# Patient Record
Sex: Female | Born: 1950 | Race: White | Hispanic: No | Marital: Married | Smoking: Former smoker
Health system: Southern US, Community
[De-identification: ages and names within clinical notes are randomized; demographics above are authoritative.]

## PROBLEM LIST (undated history)

## (undated) DIAGNOSIS — R002 Palpitations: Secondary | ICD-10-CM

## (undated) DIAGNOSIS — I10 Essential (primary) hypertension: Secondary | ICD-10-CM

## (undated) DIAGNOSIS — R87619 Unspecified abnormal cytological findings in specimens from cervix uteri: Secondary | ICD-10-CM

## (undated) DIAGNOSIS — B009 Herpesviral infection, unspecified: Secondary | ICD-10-CM

## (undated) DIAGNOSIS — H269 Unspecified cataract: Secondary | ICD-10-CM

## (undated) DIAGNOSIS — C4491 Basal cell carcinoma of skin, unspecified: Secondary | ICD-10-CM

## (undated) DIAGNOSIS — M6530 Trigger finger, unspecified finger: Secondary | ICD-10-CM

## (undated) DIAGNOSIS — D219 Benign neoplasm of connective and other soft tissue, unspecified: Secondary | ICD-10-CM

## (undated) DIAGNOSIS — I471 Supraventricular tachycardia: Secondary | ICD-10-CM

## (undated) DIAGNOSIS — G5600 Carpal tunnel syndrome, unspecified upper limb: Secondary | ICD-10-CM

## (undated) DIAGNOSIS — N3941 Urge incontinence: Secondary | ICD-10-CM

## (undated) DIAGNOSIS — M1990 Unspecified osteoarthritis, unspecified site: Secondary | ICD-10-CM

## (undated) HISTORY — DX: Palpitations: R00.2

## (undated) HISTORY — DX: Urge incontinence: N39.41

## (undated) HISTORY — DX: Unspecified abnormal cytological findings in specimens from cervix uteri: R87.619

## (undated) HISTORY — DX: Supraventricular tachycardia: I47.1

## (undated) HISTORY — DX: Trigger finger, unspecified finger: M65.30

## (undated) HISTORY — DX: Benign neoplasm of connective and other soft tissue, unspecified: D21.9

## (undated) HISTORY — DX: Essential (primary) hypertension: I10

## (undated) HISTORY — DX: Unspecified cataract: H26.9

## (undated) HISTORY — DX: Herpesviral infection, unspecified: B00.9

## (undated) HISTORY — DX: Basal cell carcinoma of skin, unspecified: C44.91

## (undated) HISTORY — PX: COLONOSCOPY: SHX174

---

## 1971-11-25 HISTORY — PX: BREAST ENHANCEMENT SURGERY: SHX7

## 1998-10-03 ENCOUNTER — Other Ambulatory Visit: Admission: RE | Admit: 1998-10-03 | Discharge: 1998-10-03 | Payer: Self-pay | Admitting: *Deleted

## 1999-12-30 ENCOUNTER — Other Ambulatory Visit: Admission: RE | Admit: 1999-12-30 | Discharge: 1999-12-30 | Payer: Self-pay | Admitting: *Deleted

## 2000-12-30 ENCOUNTER — Other Ambulatory Visit: Admission: RE | Admit: 2000-12-30 | Discharge: 2000-12-30 | Payer: Self-pay | Admitting: *Deleted

## 2002-02-24 ENCOUNTER — Other Ambulatory Visit: Admission: RE | Admit: 2002-02-24 | Discharge: 2002-02-24 | Payer: Self-pay | Admitting: *Deleted

## 2003-04-17 ENCOUNTER — Other Ambulatory Visit: Admission: RE | Admit: 2003-04-17 | Discharge: 2003-04-17 | Payer: Self-pay | Admitting: *Deleted

## 2003-11-25 HISTORY — PX: FACIAL COSMETIC SURGERY: SHX629

## 2004-07-24 ENCOUNTER — Other Ambulatory Visit: Admission: RE | Admit: 2004-07-24 | Discharge: 2004-07-24 | Payer: Self-pay | Admitting: *Deleted

## 2005-11-24 HISTORY — PX: GREAT TOE ARTHRODESIS, INTERPHALANGEAL JOINT: SUR55

## 2006-01-19 ENCOUNTER — Other Ambulatory Visit: Admission: RE | Admit: 2006-01-19 | Discharge: 2006-01-19 | Payer: Self-pay | Admitting: Obstetrics and Gynecology

## 2006-10-06 ENCOUNTER — Ambulatory Visit (HOSPITAL_BASED_OUTPATIENT_CLINIC_OR_DEPARTMENT_OTHER): Admission: RE | Admit: 2006-10-06 | Discharge: 2006-10-06 | Payer: Self-pay | Admitting: Orthopedic Surgery

## 2006-10-24 HISTORY — PX: TONSILLECTOMY: SUR1361

## 2007-01-20 ENCOUNTER — Other Ambulatory Visit: Admission: RE | Admit: 2007-01-20 | Discharge: 2007-01-20 | Payer: Self-pay | Admitting: Obstetrics & Gynecology

## 2008-02-07 ENCOUNTER — Other Ambulatory Visit: Admission: RE | Admit: 2008-02-07 | Discharge: 2008-02-07 | Payer: Self-pay | Admitting: Obstetrics & Gynecology

## 2008-04-24 HISTORY — PX: HYSTEROSCOPY WITH RESECTOSCOPE: SHX5395

## 2008-04-25 ENCOUNTER — Ambulatory Visit (HOSPITAL_COMMUNITY): Admission: RE | Admit: 2008-04-25 | Discharge: 2008-04-25 | Payer: Self-pay | Admitting: Obstetrics & Gynecology

## 2008-04-25 ENCOUNTER — Encounter: Payer: Self-pay | Admitting: Obstetrics & Gynecology

## 2009-03-08 ENCOUNTER — Other Ambulatory Visit: Admission: RE | Admit: 2009-03-08 | Discharge: 2009-03-08 | Payer: Self-pay | Admitting: Obstetrics & Gynecology

## 2011-04-08 NOTE — Op Note (Signed)
NAME:  Lindsey Robinson, Lindsey Robinson             ACCOUNT NO.:  [id number]   MEDICAL RECORD NO.:  [id number]          PATIENT TYPE:  AMB   LOCATION:  SDC                           FACILITY:  [facility]   PHYSICIAN:  M. Leda Quail, MD  DATE OF BIRTH:  1951-07-26   DATE OF PROCEDURE:  04/25/2008  DATE OF DISCHARGE:                               OPERATIVE REPORT   PREOPERATIVE DIAGNOSES:  81. A 60 year old G2, P2 married white female with postmenopausal      bleeding.  2. Endometrial polyp.  3. History of uterine fibroids.   POSTOPERATIVE DIAGNOSES:  3. A 60 year old G2, P2 married white female with postmenopausal      bleeding.  2. Endometrial polyp.  3. History of uterine fibroids.   PROCEDURE:  Hysteroscopy with polyp resection using a resectoscope  followed by dilation and curettage.   SURGEON:  M. Leda Quail, MD   ASSISTANT:  OR staff.   ANESTHESIA:  General using LMA.  Dr. Jean Rosenthal over saw the case.   FINDINGS:  Endometrial polyp.   SPECIMENS:  Polyp and endometrial curettings sent together to pathology.   ESTIMATED BLOOD LOSS:  Minimal.   FLUIDS:  400 mL of LR.   URINE OUTPUT:  150 mL of clear urine drained in and out Foley catheter.   FLUID DEFICIT:  Sorbitol 2% was used for the hysteroscopic media and the  deficit was 55 mL.   COMPLICATIONS:  None.   INDICATIONS:  Lindsey Robinson is a very nice 60 year old G2, P2 married white  female with postmenopausal bleeding.  She presented with bright red  bleeding in the office.  This was evaluated by performing an endometrial  biopsy.  Pieces of polyp were noted on path report.  Because of this, I  discussed with the patient proceeding with the ultrasound to take a look  at the endometrial thickness or just going to the operating room and  doing a hysteroscopy polyp resection if one indeed was seen.  The  patient opted for the second option because it required fewer steps and  also because I did feel like there was going to be a polyp  present.  She  was counseled about risks and benefits.  These are documented in the  office chart.  The patient presents for this today.  Informed consent  was present on her chart.   PROCEDURE:  The patient was taken to the operating room and she was  placed in a supine position.  LMA, using general anesthesia, was  administered by the anesthesia staff without difficulty.  Legs were  positioned in the low lithotomy position in the [place] stirrups.  Perineum, thighs, and vagina were prepped and draped in normal sterile  fashion.  A red rubber Foley catheter was inserted to the bladder and  the bladder was drained of all urine.   A speculum was placed into the vagina.  The anterior lip of the cervix  was grasped with a single-tooth tenaculum.  Paracervical block with 1%  lidocaine was instilled in the cervix.  A 10 mL total was instilled and  2-1/2 mL were placed  at the 3, 6, 9, and 12 o'clock positions  respectively.  The uterus was then sounded to 8 cm.  Using [facility]  dilators, the cervix was dilated up to 19.  A 3.9-diagnostic  hysteroscope was obtained and was passed through the endocervical canal.  There was a sizeable polyp noted at the fundus off to the patient's  right.  Further documentation of this tubal ostia and a thin endometrial  cavity otherwise was obtained.  The cervix was then dilated up to #23  and a polyp forceps was obtained.  The polyp was grasped and with 4 or 5  passes of the polyp forceps, most of the polyp was removed.  The cervix  was then dilated up to #29 and the resectoscope was passed through the  cervical canal.  There was a small amount of polyp left.  A single loop  was already placed on the resectoscope.  By drawing the loop back to the  scope, base of the polyp was cauterized and any extra filthy tissue was  removed.  The resectoscope was removed at this point.  Then, using a #1  tooth curette, the endometrial cavity was curetted, and a rough gritty   texture was noted.  Very minimal tissue was obtained.  At this point,  all instruments were removed from the vagina.  The tenaculum was removed  from the cervix.  There was no bleeding from the tenaculum site.  The  speculum was removed from the vagina.  Betadine prep solution has  cleansed off the skin and the patient was placed back in the supine  position.  Sponge, lap, and instrument counts were correct x2.  There  was no needle used on the field.  The patient tolerated the procedure  well.  She was awakened from anesthesia and taken to the recovery room  in stable addition.      Lum Keas, MD  Electronically Signed     MSM/MEDQ  D:  04/25/2008  T:  04/26/2008  Job:  [id number]

## 2011-04-11 NOTE — Op Note (Signed)
Lindsey Robinson, Lindsey Robinson             ACCOUNT NO.:  [id number]   MEDICAL RECORD NO.:  [id number]          PATIENT TYPE:  AMB   LOCATION:  DSC                          FACILITY:  [facility]   PHYSICIAN:  Leonides Grills, M.D.     DATE OF BIRTH:  Apr 08, 1951   DATE OF PROCEDURE:  10/06/2006  DATE OF DISCHARGE:                                 OPERATIVE REPORT   PREOPERATIVE DIAGNOSIS:  Right hallux rigidus.   POSTOPERATIVE DIAGNOSIS:  Right hallux rigidus.   OPERATION:  Right toe cheilectomy.   ANESTHESIA:  General.   SURGEON:  Dr. Leonides Grills.   ASSISTANT:  Evlyn Kanner, P.A.-C.   ESTIMATED BLOOD LOSS:  Minimal.   TOURNIQUET TIME:  Approximately 20 minutes.   COMPLICATIONS:  None.   DISPOSITION:  Stable to the PR.   INDICATIONS FOR PROCEDURE:  This is a 60 year old female who has had  longstanding dorsal right great toe pain that was interfering with her life  and she could not do what she wanted to do despite conservative management.  She consented to the above procedure with risks which include infection or  vessel injury, persistent pain, worsening pain, stiffness or arthritis, and  the possibility of a future fusion were all explained.  Questions were  answered.   OPERATION:  The patient was brought into the operating room and placed in  the supine position.  After adequate general endotracheal tube anesthesia  was administered as well as Ancef 1 gram IV piggyback, right lower extremity  was then prepped and draped in a sterile fashion and placed thigh  tourniquet.  Limb was gravity exsanguinated and tourniquet was elevated to  290 mmHg.  A longitudinal incision over the dorsomedial aspect of the right  great toe MTP joint was then made.  Dissection was then carried down through  skin.  Hemostasis was obtained.  A dorsal capsulotomy was made just medial  to the extensor hallucis longus.  Once this was done, soft tissue was  elevated, both medially and laterally.  This exposed  the osteophytes and a  loose body dorsally.  The loose body was then removed with a rongeur.  With  a saggital saw, the dorsal spur was then removed as well as with a rongeur.  Both medial and lateral gutters were also debrided with a rongeur as well.  The toe was ranged.  There was no impingement and the remaining part of the  cartilage looked mildly to moderately arthritic.  The dorsal spur at the  base of the proximal phalanx was also debrided with a rongeur as well.  The  area was copiously irrigated with normal saline.  Range of motion of the toe  was improved to about 55 degrees of dorsiflexion.  Once the area was  copiously irrigated with normal saline, bone wax was applied to exposed bone  surfaces.  Capsule was closed with a 3-0 Vicryl stitch.  Tourniquet was  deflated.  Hemostasis was obtained.  SubQ was closed with 3-0 Vicryl.  Skin  was closed with 4-0 nylon.  Sterile dressing was applied.  Hard-soled shoe  was applied.  The patient was taken to the PR.      Leonides Grills, M.D.  Electronically Signed     PB/MEDQ  D:  10/06/2006  T:  10/07/2006  Job:  [id number]

## 2011-08-21 LAB — CBC
HCT: 42
Hemoglobin: 14.6
MCHC: 34.9
MCV: 94.6
Platelets: 264
RBC: 4.44
RDW: 12.6
WBC: 3.8 — ABNORMAL LOW

## 2011-08-21 LAB — URINALYSIS, ROUTINE W REFLEX MICROSCOPIC
Bilirubin Urine: NEGATIVE
Glucose, UA: NEGATIVE
Hgb urine dipstick: NEGATIVE
Ketones, ur: NEGATIVE
Nitrite: NEGATIVE
Protein, ur: NEGATIVE
Specific Gravity, Urine: <1.005 — ABNORMAL LOW
Urobilinogen, UA: 0.2
pH: 6

## 2011-11-25 HISTORY — PX: CATARACT EXTRACTION: SUR2

## 2012-10-14 ENCOUNTER — Other Ambulatory Visit: Payer: Self-pay | Admitting: Orthopedic Surgery

## 2012-10-25 ENCOUNTER — Encounter (HOSPITAL_BASED_OUTPATIENT_CLINIC_OR_DEPARTMENT_OTHER): Payer: Self-pay | Admitting: *Deleted

## 2012-10-25 NOTE — H&P (Signed)
  Lindsey Robinson is an 61 y.o. female.   Chief Complaint: c/o chronic and progressive numbness and tingling of the left hand HPI: Lindsey Robinson is a well known family friend and patient who has had chronic carpal tunnel syndrome for years. She has had multiple injections and has now decided to proceed with release of her transverse carpal ligament. The surgery, after care, risks and benefits were described in detail.    Past Medical History  Diagnosis Date  . Arthritis   . Carpal tunnel syndrome     Past Surgical History  Procedure Date  . Dilation and curettage of uterus 2009  . Great toe arthrodesis, interphalangeal joint 2007    rt  . Tonsillectomy   . Colonoscopy   . Facial cosmetic surgery     No family history on file. Social History:  reports that she has never smoked. She does not have any smokeless tobacco history on file. She reports that she drinks alcohol. She reports that she does not use illicit drugs.  Allergies: No Known Allergies  No prescriptions prior to admission    No results found for this or any previous visit (from the past 48 hour(s)).  No results found.   Pertinent items are noted in HPI.  Height 5\' 2"  (1.575 m), weight 49.896 kg (110 lb).  General appearance: alert Head: Normocephalic, without obvious abnormality Neck: supple, symmetrical, trachea midline Resp: clear to auscultation bilaterally Cardio: regular rate and rhythm GI: normal findings: bowel sounds normal Extremities: Exam of the left hand reveals positive Phalen's and Tinel's. FROM of the digits and wrist. Normal sweat pattern.Review of the NCV reveals moderate left CTS Pulses: 2+ and symmetric Skin: normal Neurologic: Grossly normal    Assessment/Plan Impression: Left CTS  Plan: To the OR for left CTR. The procedure, risks,benefits and post-op course were discussed with the patient at length and they were in agreement with the plan.   DASNOIT,[name] 10/25/2012, 12:07  PM     H&P documentation: 10/26/2012  -History and Physical Reviewed  -Patient has been re-examined  -No change in the plan of care  [name], MD

## 2012-10-26 ENCOUNTER — Encounter (HOSPITAL_BASED_OUTPATIENT_CLINIC_OR_DEPARTMENT_OTHER): Payer: Self-pay | Admitting: Anesthesiology

## 2012-10-26 ENCOUNTER — Ambulatory Visit (HOSPITAL_BASED_OUTPATIENT_CLINIC_OR_DEPARTMENT_OTHER): Payer: BC Managed Care – PPO | Admitting: Anesthesiology

## 2012-10-26 ENCOUNTER — Encounter (HOSPITAL_BASED_OUTPATIENT_CLINIC_OR_DEPARTMENT_OTHER)
Admission: RE | Disposition: A | Discharge status: Home / Self Care | Payer: Self-pay | Source: Ambulatory Visit | Attending: Orthopedic Surgery

## 2012-10-26 ENCOUNTER — Ambulatory Visit (HOSPITAL_BASED_OUTPATIENT_CLINIC_OR_DEPARTMENT_OTHER)
Admission: RE | Admit: 2012-10-26 | Discharge: 2012-10-26 | Disposition: A | Discharge status: Home / Self Care | Payer: BC Managed Care – PPO | Source: Ambulatory Visit | Attending: Orthopedic Surgery | Admitting: Orthopedic Surgery

## 2012-10-26 ENCOUNTER — Encounter (HOSPITAL_BASED_OUTPATIENT_CLINIC_OR_DEPARTMENT_OTHER): Payer: Self-pay | Admitting: *Deleted

## 2012-10-26 DIAGNOSIS — G5600 Carpal tunnel syndrome, unspecified upper limb: Secondary | ICD-10-CM | POA: Insufficient documentation

## 2012-10-26 DIAGNOSIS — M129 Arthropathy, unspecified: Secondary | ICD-10-CM | POA: Insufficient documentation

## 2012-10-26 HISTORY — DX: Carpal tunnel syndrome, unspecified upper limb: G56.00

## 2012-10-26 HISTORY — DX: Unspecified osteoarthritis, unspecified site: M19.90

## 2012-10-26 HISTORY — PX: CARPAL TUNNEL RELEASE: SHX101

## 2012-10-26 LAB — POCT HEMOGLOBIN-HEMACUE: Hemoglobin: 14.6 g/dL (ref 12.0–15.0)

## 2012-10-26 SURGERY — CARPAL TUNNEL RELEASE
Anesthesia: General | Site: Wrist | Laterality: Left | Wound class: Clean

## 2012-10-26 MED ORDER — LACTATED RINGERS IV SOLN
INTRAVENOUS | Status: DC
  Administered 2012-10-26: 20 mL/h via INTRAVENOUS

## 2012-10-26 MED ORDER — OXYCODONE HCL 5 MG/5ML PO SOLN: 5.0000 mg | Freq: Once | ORAL | Status: DC | PRN

## 2012-10-26 MED ORDER — PROPOFOL 10 MG/ML IV BOLUS
INTRAVENOUS | Status: DC | PRN
  Administered 2012-10-26: 150 mg via INTRAVENOUS

## 2012-10-26 MED ORDER — PROMETHAZINE HCL 25 MG/ML IJ SOLN: 6.2500 mg | INTRAMUSCULAR | Status: DC | PRN

## 2012-10-26 MED ORDER — MIDAZOLAM HCL 5 MG/5ML IJ SOLN
INTRAMUSCULAR | Status: DC | PRN
  Administered 2012-10-26: 1 mg via INTRAVENOUS

## 2012-10-26 MED ORDER — DEXAMETHASONE SODIUM PHOSPHATE 4 MG/ML IJ SOLN
INTRAMUSCULAR | Status: DC | PRN
  Administered 2012-10-26: 8 mg via INTRAVENOUS

## 2012-10-26 MED ORDER — ONDANSETRON HCL 4 MG/2ML IJ SOLN
INTRAMUSCULAR | Status: DC | PRN
  Administered 2012-10-26: 4 mg via INTRAVENOUS

## 2012-10-26 MED ORDER — MEPERIDINE HCL 25 MG/ML IJ SOLN: 6.2500 mg | INTRAMUSCULAR | Status: DC | PRN

## 2012-10-26 MED ORDER — OXYCODONE HCL 5 MG PO TABS: 5.0000 mg | ORAL_TABLET | Freq: Once | ORAL | Status: DC | PRN

## 2012-10-26 MED ORDER — LIDOCAINE HCL 2 % IJ SOLN
INTRAMUSCULAR | Status: DC | PRN
  Administered 2012-10-26: 4 mL

## 2012-10-26 MED ORDER — FENTANYL CITRATE 0.05 MG/ML IJ SOLN
INTRAMUSCULAR | Status: DC | PRN
  Administered 2012-10-26 (×2): 50 ug via INTRAVENOUS

## 2012-10-26 MED ORDER — HYDROCODONE-ACETAMINOPHEN 5-325 MG PO TABS: ORAL_TABLET | ORAL | Status: DC

## 2012-10-26 MED ORDER — CHLORHEXIDINE GLUCONATE 4 % EX LIQD: 60.0000 mL | Freq: Once | CUTANEOUS | Status: DC

## 2012-10-26 MED ORDER — FENTANYL CITRATE 0.05 MG/ML IJ SOLN: 25.0000 ug | INTRAMUSCULAR | Status: DC | PRN

## 2012-10-26 MED ORDER — MIDAZOLAM HCL 2 MG/2ML IJ SOLN: 0.5000 mg | Freq: Once | INTRAMUSCULAR | Status: DC | PRN

## 2012-10-26 SURGICAL SUPPLY — 37 items
BANDAGE ADHESIVE 1X3 (GAUZE/BANDAGES/DRESSINGS) IMPLANT
BANDAGE ELASTIC 3 VELCRO ST LF (GAUZE/BANDAGES/DRESSINGS) ×2 IMPLANT
BLADE SURG 15 STRL LF DISP TIS (BLADE) ×1 IMPLANT
BLADE SURG 15 STRL SS (BLADE) ×2
BNDG CMPR 9X4 STRL LF SNTH (GAUZE/BANDAGES/DRESSINGS) ×1
BNDG ESMARK 4X9 LF (GAUZE/BANDAGES/DRESSINGS) ×1 IMPLANT
BRUSH SCRUB EZ PLAIN DRY (MISCELLANEOUS) ×2 IMPLANT
CLOTH BEACON ORANGE TIMEOUT ST (SAFETY) ×2 IMPLANT
CORDS BIPOLAR (ELECTRODE) IMPLANT
COVER MAYO STAND STRL (DRAPES) ×2 IMPLANT
COVER TABLE BACK 60X90 (DRAPES) ×2 IMPLANT
CUFF TOURNIQUET SINGLE 18IN (TOURNIQUET CUFF) ×1 IMPLANT
DECANTER SPIKE VIAL GLASS SM (MISCELLANEOUS) IMPLANT
DRAPE EXTREMITY T 121X128X90 (DRAPE) ×2 IMPLANT
DRAPE SURG 17X23 STRL (DRAPES) ×2 IMPLANT
GLOVE BIO SURGEON STRL SZ 6.5 (GLOVE) ×1 IMPLANT
GLOVE BIOGEL M STRL SZ7.5 (GLOVE) ×2 IMPLANT
GLOVE ORTHO TXT STRL SZ7.5 (GLOVE) ×2 IMPLANT
GOWN PREVENTION PLUS XLARGE (GOWN DISPOSABLE) ×2 IMPLANT
GOWN PREVENTION PLUS XXLARGE (GOWN DISPOSABLE) ×4 IMPLANT
NEEDLE 27GAX1X1/2 (NEEDLE) ×1 IMPLANT
PACK BASIN DAY SURGERY FS (CUSTOM PROCEDURE TRAY) ×2 IMPLANT
PAD CAST 3X4 CTTN HI CHSV (CAST SUPPLIES) ×1 IMPLANT
PADDING CAST ABS 4INX4YD NS (CAST SUPPLIES) ×1
PADDING CAST ABS COTTON 4X4 ST (CAST SUPPLIES) ×1 IMPLANT
PADDING CAST COTTON 3X4 STRL (CAST SUPPLIES) ×2
SPLINT PLASTER CAST XFAST 3X15 (CAST SUPPLIES) ×5 IMPLANT
SPLINT PLASTER XTRA FASTSET 3X (CAST SUPPLIES) ×5
SPONGE GAUZE 4X4 12PLY (GAUZE/BANDAGES/DRESSINGS) ×2 IMPLANT
STOCKINETTE 4X48 STRL (DRAPES) ×2 IMPLANT
STRIP CLOSURE SKIN 1/2X4 (GAUZE/BANDAGES/DRESSINGS) ×2 IMPLANT
SUT PROLENE 3 0 PS 2 (SUTURE) ×2 IMPLANT
SYR 3ML 23GX1 SAFETY (SYRINGE) IMPLANT
SYR CONTROL 10ML LL (SYRINGE) ×1 IMPLANT
TRAY DSU PREP LF (CUSTOM PROCEDURE TRAY) ×2 IMPLANT
UNDERPAD 30X30 INCONTINENT (UNDERPADS AND DIAPERS) ×2 IMPLANT
WATER STERILE IRR 1000ML POUR (IV SOLUTION) ×2 IMPLANT

## 2012-10-26 NOTE — Anesthesia Preprocedure Evaluation (Signed)
Anesthesia Evaluation  Patient identified by MRN, date of birth, ID band Patient awake    Reviewed: Allergy & Precautions, H&P , NPO status , Patient's Chart, lab work & pertinent test results  History of Anesthesia Complications Negative for: history of anesthetic complications  Airway Mallampati: I TM Distance: >3 FB Neck ROM: Full    Dental  (+) Teeth Intact and Dental Advisory Given   Pulmonary neg pulmonary ROS,  breath sounds clear to auscultation  Pulmonary exam normal       Cardiovascular negative cardio ROS  Rhythm:Regular Rate:Normal     Neuro/Psych negative neurological ROS     GI/Hepatic negative GI ROS, Neg liver ROS,   Endo/Other  negative endocrine ROS  Renal/GU negative Renal ROS     Musculoskeletal   Abdominal (+) - obese,   Peds  Hematology   Anesthesia Other Findings   Reproductive/Obstetrics                           Anesthesia Physical Anesthesia Plan  ASA: I  Anesthesia Plan: General   Post-op Pain Management:    Induction: Intravenous  Airway Management Planned: LMA  Additional Equipment:   Intra-op Plan:   Post-operative Plan:   Informed Consent: I have reviewed the patients History and Physical, chart, labs and discussed the procedure including the risks, benefits and alternatives for the proposed anesthesia with the patient or authorized representative who has indicated his/her understanding and acceptance.   Dental advisory given  Plan Discussed with: CRNA and Surgeon  Anesthesia Plan Comments: (Plan routine monitors, GA- LMA OK)        Anesthesia Quick Evaluation

## 2012-10-26 NOTE — Anesthesia Procedure Notes (Signed)
Procedure Name: LMA Insertion Date/Time: 10/26/2012 9:37 AM Performed by: Norva Pavlov Pre-anesthesia Checklist: Patient identified, Emergency Drugs available, Suction available and Patient being monitored Patient Re-evaluated:Patient Re-evaluated prior to inductionOxygen Delivery Method: Circle System Utilized Preoxygenation: Pre-oxygenation with 100% oxygen Intubation Type: IV induction Ventilation: Mask ventilation without difficulty LMA: LMA inserted LMA Size: 3.0 Number of attempts: 1 Airway Equipment and Method: bite block Placement Confirmation: positive ETCO2 Tube secured with: Tape Dental Injury: Teeth and Oropharynx as per pre-operative assessment

## 2012-10-26 NOTE — Transfer of Care (Signed)
Immediate Anesthesia Transfer of Care Note  Patient: Lindsey Robinson  Procedure(s) Performed: Procedure(s) (LRB): CARPAL TUNNEL RELEASE (Left)  Patient Location: PACU  Anesthesia Type: General  Level of Consciousness: awake, alert  and oriented  Airway & Oxygen Therapy: Patient Spontanous Breathing and Patient connected to face mask oxygen  Post-op Assessment: Report given to PACU RN and Post -op Vital signs reviewed and stable  Post vital signs: Reviewed and stable  Complications: No apparent anesthesia complications

## 2012-10-26 NOTE — Anesthesia Postprocedure Evaluation (Signed)
  Anesthesia Post-op Note  Patient: Lindsey Robinson  Procedure(s) Performed: Procedure(s) (LRB) with comments: CARPAL TUNNEL RELEASE (Left)  Patient Location: PACU  Anesthesia Type:General  Level of Consciousness: awake, alert , oriented and patient cooperative  Airway and Oxygen Therapy: Patient Spontanous Breathing  Post-op Pain: none  Post-op Assessment: Post-op Vital signs reviewed, Patient's Cardiovascular Status Stable, Respiratory Function Stable, Patent Airway, No signs of Nausea or vomiting and Pain level controlled  Post-op Vital Signs: Reviewed and stable  Complications: No apparent anesthesia complications

## 2012-10-26 NOTE — Op Note (Signed)
[id number] 

## 2012-10-26 NOTE — Brief Op Note (Signed)
10/26/2012  9:56 AM  PATIENT:  Lindsey Robinson  61 y.o. female  PRE-OPERATIVE DIAGNOSIS:  left carpal tunnel syndrome   POST-OPERATIVE DIAGNOSIS:  left Carpal Tunnel Syndrome  PROCEDURE:  Procedure(s) (LRB) with comments: CARPAL TUNNEL RELEASE (Left)  SURGEON:  Surgeon(s) and Role:    * [name]., MD - Primary  PHYSICIAN ASSISTANT:   ASSISTANTS:Yarimar Lavis Dasnoit,P.A-C   ANESTHESIA:   general  EBL:  Total I/O In: 200 [I.V.:200] Out: -   BLOOD ADMINISTERED:none  DRAINS: none   LOCAL MEDICATIONS USED:  XYLOCAINE   SPECIMEN:  No Specimen  DISPOSITION OF SPECIMEN:  N/A  COUNTS:  YES  TOURNIQUET:   Total Tourniquet Time Documented: Upper Arm (Left) - 9 minutes  DICTATION: .Other Dictation: Dictation Number [id number]  PLAN OF CARE: Discharge to home after PACU  PATIENT DISPOSITION:  PACU - hemodynamically stable.

## 2012-10-27 ENCOUNTER — Encounter (HOSPITAL_BASED_OUTPATIENT_CLINIC_OR_DEPARTMENT_OTHER): Payer: Self-pay | Admitting: Orthopedic Surgery

## 2012-10-27 NOTE — Op Note (Signed)
NAMESAMAURI, KELLENBERGER             ACCOUNT NO.:  [id number]  MEDICAL RECORD NO.:  [id number]  LOCATION:                                 FACILITY:  PHYSICIAN:  Katy Fitch. Ebonique Hallstrom, M.D.      DATE OF BIRTH:  DATE OF PROCEDURE:  10/26/2012 DATE OF DISCHARGE:                              OPERATIVE REPORT   PREOPERATIVE DIAGNOSIS:  Chronic left carpal tunnel syndrome unresponsive to steroid injection, splinting and activity modification.  POSTOPERATIVE DIAGNOSIS:  Chronic left carpal tunnel syndrome unresponsive to steroid injection, splinting and activity modification.  OPERATION:  Release of left transverse carpal ligament.  OPERATING SURGEON:  Katy Fitch. Rogen Porte, M.D.  ASSISTANT:  Marveen Reeks Dasnoit, PA-C  ANESTHESIA:  General by LMA.  SUPERVISING ANESTHESIOLOGIST:  Germaine Pomfret, M.D.  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  She has had a history of carpal tunnel syndrome symptoms dating back to 2010.  She has had electrodiagnostic studies confirming bilateral moderate carpal tunnel syndrome.  For many years, Dennie Bible had relief with steroid injections and splinting at night.  However during the past 6 months, she has been unresponsive on the left side to this modality of treatment.  She has had at least four injections into the left wrist with transient relief until the fall of 2013.  We advised her after multiple injections that would be in her best interest to proceed with release of the transverse carpal ligament at this time.  She presents anticipating release under general anesthesia.  Questions regarding the anticipated surgery were invited and answered in detail in the office as well in the holding area.  PROCEDURE:  Mckinzie Saksa was brought to room #6 of the [facility] and placed in supine position on the operating table.  Following the induction of general anesthesia by LMA  technique under Dr. Edison Pace direct supervision, the left hand and arm were prepped with Betadine soap and solution, sterilely draped.  Pneumatic tourniquet was applied to the proximal left brachium and set at 220 mmHg.  Following exsanguination of left arm with an Esmarch bandage, the arterial tourniquet was inflated to 220 mmHg.  Following routine surgical time-out, the procedure commenced with a short incision in the line of the ring finger and the palm. Subcutaneous tissues were carefully divided revealing the palmar fascia. This was split in line of its fibers to reveal the common sensory branch of the median nerve and the superficial palmar arch.  The carpal canal was sounded with a [place] 4 elevator followed by meticulous release of the transverse carpal ligament along its ulnar border, directly off the hook of the hamate into the distal forearm.  At the proximal margin of the ligament, there was a crossing muscle which was gently teased apart looking for aberrant motor branches.  Following complete release of the ligament and release of the volar forearm fascia with scissors into the distal forearm approximately 4 cm, the canal was inspected with the aid of a Sewall retractor.  There was a persistent median vessel that was quite diminutive.  The motor branch was inspected distally and found to be unimpeded.  Bleeding points along the margin of the released ligament were electrocauterized with bipolar current followed by repair of the skin with intradermal 3-0 Prolene suture.  Steri-Strip was applied followed by infiltration of the wound margins with 2% lidocaine.  Ms. Enterline was placed in compressive dressing with volar plaster splint maintaining the wrist in 15 degrees of dorsiflexion.  For aftercare, she is provided a prescription for Vicodin 5 mg 1 p.o. q.4-6 hours p.r.n. pain, 20 tablets without refill.     Katy Fitch Tayton Decaire, M.D.     RVS/MEDQ  D:   10/26/2012  T:  10/26/2012  Job:  [id number]

## 2012-11-19 ENCOUNTER — Other Ambulatory Visit: Payer: Self-pay | Admitting: Dermatology

## 2013-06-23 ENCOUNTER — Encounter: Payer: Self-pay | Admitting: Obstetrics & Gynecology

## 2013-06-23 ENCOUNTER — Ambulatory Visit (INDEPENDENT_AMBULATORY_CARE_PROVIDER_SITE_OTHER): Payer: BC Managed Care – PPO | Admitting: Obstetrics & Gynecology

## 2013-06-23 VITALS — BP 114/68 | HR 64 | Resp 12 | Ht 62.0 in | Wt 111.0 lb

## 2013-06-23 DIAGNOSIS — Z01419 Encounter for gynecological examination (general) (routine) without abnormal findings: Secondary | ICD-10-CM

## 2013-06-23 DIAGNOSIS — Z0000 Encounter for general adult medical examination without abnormal findings: Secondary | ICD-10-CM

## 2013-06-23 LAB — POCT URINALYSIS DIPSTICK
Bilirubin, UA: NEGATIVE
Blood, UA: NEGATIVE
Glucose, UA: NEGATIVE
Ketones, UA: NEGATIVE
Leukocytes, UA: NEGATIVE
Nitrite, UA: NEGATIVE
Protein, UA: NEGATIVE
Urobilinogen, UA: NEGATIVE
pH, UA: 5

## 2013-06-23 MED ORDER — VITAMIN D (ERGOCALCIFEROL) 1.25 MG (50000 UNIT) PO CAPS: ORAL_CAPSULE | ORAL | Status: DC

## 2013-06-23 MED ORDER — VALACYCLOVIR HCL 500 MG PO TABS: 500.0000 mg | ORAL_TABLET | Freq: Two times a day (BID) | ORAL | Status: DC

## 2013-06-23 MED ORDER — ESTRADIOL 10 MCG VA TABS: 10.0000 ug | ORAL_TABLET | VAGINAL | Status: DC

## 2013-06-23 NOTE — Progress Notes (Signed)
61 y.o. G2P2 MarriedCaucasianF here for annual exam.  No vaginal bleeding.  Has seen Dr. Earl Gala within last six months.  Did have blood work.  Everything was ok.  Had zostavax with Dr. Earl Gala.    Patient's last menstrual period was 02/23/2008.          Sexually active: yes  The current method of family planning is vasectomy.    Exercising: yes  walking and weight training Smoker:  no  Health Maintenance: Pap:  06/02/12 WNL/negative HR HPV History of abnormal Pap:  no MMG:  11/03/12 3D normal Colonoscopy:  2005 repeat in 10 years BMD:   3/09 TDaP:  01/2008 Screening Labs: with PCP, Urine today: negative   reports that she has never smoked. She has never used smokeless tobacco. She reports that  drinks alcohol. She reports that she does not use illicit drugs.  Past Medical History  Diagnosis Date  . Arthritis   . Carpal tunnel syndrome   . HSV-2 infection   . Fibroids   . Urge incontinence of urine   . Breast mass     cyst on [place]    Past Surgical History  Procedure Laterality Date  . Dilation and curettage of uterus  2009  . Great toe arthrodesis, interphalangeal joint  2007    rt  . Tonsillectomy      T&A  . Colonoscopy    . Facial cosmetic surgery    . Carpal tunnel release  10/26/2012    Procedure: CARPAL TUNNEL RELEASE;  Surgeon: [name]., MD;  Location: [facility];  Service: Orthopedics;  Laterality: Left;  . Breast enhancement surgery    . Hysteroscopy      polyp resection  . Cataract extraction      left eye    Current Outpatient Prescriptions  Medication Sig Dispense Refill  . Estradiol (VAGIFEM) 10 MCG TABS vaginal tablet Place 10 mcg vaginally 2 (two) times a week.      . naproxen sodium (ANAPROX) 220 MG tablet Take 220 mg by mouth 2 (two) times daily with a meal.      . valACYclovir (VALTREX) 500 MG tablet Take 500 mg by mouth 2 (two) times daily.      . Vitamin D, Ergocalciferol, (DRISDOL) 50000 UNITS CAPS Take 50,000 Units by  mouth. Once monthly      . prednisoLONE acetate (PRED FORTE) 1 % ophthalmic suspension as needed.      . RESTASIS 0.05 % ophthalmic emulsion        No current facility-administered medications for this visit.    Family History  Problem Relation Age of Onset  . Diabetes Paternal Aunt   . Brain cancer Maternal Grandmother   . Hypertension Mother   . Asthma Daughter   . Kidney Stones Father     ROS:  Pertinent items are noted in HPI.  Otherwise, a comprehensive ROS was negative.  Exam:   BP 114/68  Pulse 64  Resp 12  Ht 5\' 2"  (1.575 m)  Wt 111 lb (50.349 kg)  BMI 20.3 kg/m2  LMP 02/23/2008  Weight change: @WEIGHTCHANGE @ Height:   Height: 5\' 2"  (157.5 cm)  Ht Readings from Last 3 Encounters:  06/23/13 5\' 2"  (1.575 m)  10/26/12 5\' 2"  (1.575 m)  10/26/12 5\' 2"  (1.575 m)    General appearance: alert, cooperative and appears stated age Head: Normocephalic, without obvious abnormality, atraumatic Neck: no adenopathy, supple, symmetrical, trachea midline and thyroid normal to inspection and palpation Lungs:  clear to auscultation bilaterally Breasts: normal appearance, no masses or tenderness, bilateral implants Heart: regular rate and rhythm Abdomen: soft, non-tender; bowel sounds normal; no masses,  no organomegaly Extremities: extremities normal, atraumatic, no cyanosis or edema Skin: Skin color, texture, turgor normal. No rashes or lesions Lymph nodes: Cervical, supraclavicular, and axillary nodes normal. No abnormal inguinal nodes palpated Neurologic: Grossly normal   Pelvic: External genitalia:  no lesions              Urethra:  normal appearing urethra with no masses, tenderness or lesions              Bartholins and Skenes: normal                 Vagina: normal appearing vagina with normal color and discharge, no lesions              Cervix: no lesions              Pap taken: no Bimanual Exam:  Uterus:  normal size, contour, position, consistency, mobility,  non-tender              Adnexa: normal adnexa and no mass, fullness, tenderness               Rectovaginal: Confirms               Anus:  normal sphincter tone, no lesions  A:  Well Woman with normal exam PMP, no HRT H/O HSV 2 Atrophic changes  P:   Mammogram yearly pap smear with neg HR HPV 7/13 Vagifem rx to pharmacy Valtrex 500mg  qday rx to pharamcy Vit D 50000 IU monthly rx to pharmacy return annually or prn  An After Visit Summary was printed and given to the patient.

## 2013-06-23 NOTE — Patient Instructions (Addendum)

## 2013-07-25 DIAGNOSIS — I4719 Other supraventricular tachycardia: Secondary | ICD-10-CM

## 2013-07-25 DIAGNOSIS — I471 Supraventricular tachycardia: Secondary | ICD-10-CM

## 2013-07-25 HISTORY — DX: Other supraventricular tachycardia: I47.19

## 2013-07-25 HISTORY — DX: Supraventricular tachycardia: I47.1

## 2014-03-23 ENCOUNTER — Other Ambulatory Visit: Payer: Self-pay | Admitting: Dermatology

## 2014-04-24 HISTORY — PX: CATARACT EXTRACTION: SUR2

## 2014-05-17 ENCOUNTER — Encounter: Payer: Self-pay | Admitting: Internal Medicine

## 2014-07-12 ENCOUNTER — Other Ambulatory Visit: Payer: Self-pay | Admitting: Obstetrics & Gynecology

## 2014-07-12 NOTE — Telephone Encounter (Signed)
Last AEX and refill for both Rx 06/23/13 1 year supply MMG 10/2013 BIRADS 2 Next appt 09/12/14  Please advise.

## 2014-09-12 ENCOUNTER — Ambulatory Visit (INDEPENDENT_AMBULATORY_CARE_PROVIDER_SITE_OTHER): Payer: BC Managed Care – PPO | Admitting: Obstetrics & Gynecology

## 2014-09-12 ENCOUNTER — Encounter: Payer: Self-pay | Admitting: Obstetrics & Gynecology

## 2014-09-12 VITALS — BP 106/72 | HR 64 | Resp 16 | Ht 61.75 in | Wt 106.8 lb

## 2014-09-12 DIAGNOSIS — Z1211 Encounter for screening for malignant neoplasm of colon: Secondary | ICD-10-CM

## 2014-09-12 DIAGNOSIS — M545 Low back pain, unspecified: Secondary | ICD-10-CM

## 2014-09-12 DIAGNOSIS — Z0000 Encounter for general adult medical examination without abnormal findings: Secondary | ICD-10-CM

## 2014-09-12 DIAGNOSIS — Z01419 Encounter for gynecological examination (general) (routine) without abnormal findings: Secondary | ICD-10-CM

## 2014-09-12 DIAGNOSIS — Z124 Encounter for screening for malignant neoplasm of cervix: Secondary | ICD-10-CM

## 2014-09-12 LAB — POCT URINALYSIS DIPSTICK
Bilirubin, UA: NEGATIVE
Blood, UA: NEGATIVE
Glucose, UA: NEGATIVE
Ketones, UA: NEGATIVE
Leukocytes, UA: NEGATIVE
Nitrite, UA: NEGATIVE
Protein, UA: NEGATIVE
Urobilinogen, UA: NEGATIVE
pH, UA: 5

## 2014-09-12 MED ORDER — VITAMIN D (ERGOCALCIFEROL) 1.25 MG (50000 UNIT) PO CAPS: ORAL_CAPSULE | ORAL | Status: DC

## 2014-09-12 MED ORDER — ESTRADIOL 10 MCG VA TABS: ORAL_TABLET | VAGINAL | Status: DC

## 2014-09-12 MED ORDER — VALACYCLOVIR HCL 500 MG PO TABS: ORAL_TABLET | ORAL | Status: DC

## 2014-09-12 NOTE — Progress Notes (Signed)
63 y.o. G2P2 MarriedCaucasianF here for annual exam.  Pt had a good summer.  Lives near [place] in the summer.  Pt reports she is having some increased pain in right hip and there feels to be a little "weakness".  Has also felt the same thing in her opposite hip.  Has seen Dr. Maxwell Caul about this in May.  Summer hasn't made this better or worse.  Has been assigned to Dr. Michail Sermon.    Had paroxysmal atrial tachycardia during episode of significant stress last year.  Wore a heart monitor for three days.  Metoprolol was recommended.  She chose not to take this.  Pt reports after stress improved, symptoms have significantly improved.    Patient's last menstrual period was 02/23/2008.          Sexually active: No.  The current method of family planning is post menopausal status.    Exercising: Yes  walking and weight training Smoker:  no  Health Maintenance: Pap:  06/02/12 WNL/negative HR HPV History of abnormal Pap:  no MMG:  11/04/13 3D-normal Colonoscopy:  2005-repeat in 10 years BMD:   3/09-? Due this year TDaP:  3/09 Screening Labs: PCP, Hb today: PCP, Urine today: negative   reports that she has never smoked. She has never used smokeless tobacco. She reports that she drinks about 7 ounces of alcohol per week. She reports that she does not use illicit drugs.  Past Medical History  Diagnosis Date  . Arthritis   . Carpal tunnel syndrome   . HSV-2 infection   . Fibroids   . Urge incontinence of urine   . PAT (paroxysmal atrial tachycardia) 9/14    Past Surgical History  Procedure Laterality Date  . Great toe arthrodesis, interphalangeal joint  2007    rt  . Tonsillectomy  12/07    T&A  . Facial cosmetic surgery  2005  . Carpal tunnel release  10/26/2012    Procedure: CARPAL TUNNEL RELEASE;  Surgeon: Cammie Sickle., MD;  Location: [facility];  Service: Orthopedics;  Laterality: Left;  . Breast enhancement surgery  1973  . Hysteroscopy with  resectoscope  6/09       . Cataract extraction  2013  . Cataract extraction  6/15    Current Outpatient Prescriptions  Medication Sig Dispense Refill  . ketoconazole (NIZORAL) 2 % shampoo as needed.      . naproxen sodium (ANAPROX) 220 MG tablet Take 220 mg by mouth 2 (two) times daily with a meal.      . RESTASIS 0.05 % ophthalmic emulsion       . VAGIFEM 10 MCG TABS vaginal tablet PLACE 1 TABLET VAGINALLY 2 TIMES A WEEK  24 tablet  0  . valACYclovir (VALTREX) 500 MG tablet TAKE ONE TABLET TWICE DAILY  90 tablet  0  . Vitamin D, Ergocalciferol, (DRISDOL) 50000 UNITS CAPS Once monthly  3 capsule  4   No current facility-administered medications for this visit.    Family History  Problem Relation Age of Onset  . Diabetes Paternal Aunt   . Brain cancer Maternal Grandmother   . Hypertension Mother   . Asthma Daughter   . Kidney Stones Father     ROS:  Pertinent items are noted in HPI.  Otherwise, a comprehensive ROS was negative.  Exam:   BP 106/72  Pulse 64  Resp 16  Ht 5' 1.75" (1.568 m)  Wt 106 lb 12.8 oz (48.444 kg)  BMI 19.70 kg/m2  LMP 02/23/2008  Weight change:  -5#Height: 5' 1.75" (156.8 cm)  Ht Readings from Last 3 Encounters:  09/12/14 5' 1.75" (1.568 m)  06/23/13 5\' 2"  (1.575 m)  10/26/12 5\' 2"  (1.575 m)    General appearance: alert, cooperative and appears stated age Head: Normocephalic, without obvious abnormality, atraumatic Neck: no adenopathy, supple, symmetrical, trachea midline and thyroid normal to inspection and palpation Lungs: clear to auscultation bilaterally Breasts: normal appearance, no masses or tenderness Heart: regular rate and rhythm Abdomen: soft, non-tender; bowel sounds normal; no masses,  no organomegaly Extremities: extremities normal, atraumatic, no cyanosis or edema Skin: Skin color, texture, turgor normal. No rashes or lesions Lymph nodes: Cervical, supraclavicular, and axillary nodes normal. No abnormal inguinal nodes  palpated Neurologic: Grossly normal   Pelvic: External genitalia:  no lesions              Urethra:  normal appearing urethra with no masses, tenderness or lesions              Bartholins and Skenes: normal                 Vagina: normal appearing vagina with normal color and discharge, no lesions              Cervix: no lesions              Pap taken: Yes.   Bimanual Exam:  Uterus:  normal size, contour, position, consistency, mobility, non-tender              Adnexa: normal adnexa and no mass, fullness, tenderness               Rectovaginal: Confirms               Anus:  normal sphincter tone, no lesions  A:  Well Woman with normal exam  PMP, no HRT  H/O HSV 2  Atrophic changes   P: Mammogram yearly  pap smear with neg HR HPV 7/13.  Pap today. Referral to Dr. Lynann Bologna Referral to Dr. Olevia Perches CMP, lipids, TSH, Vit D, CBC  Vagifem rx to pharmacy  Valtrex 500mg  qday rx to pharamcy  Vit D 50000 IU monthly rx to pharmacy  Needs MMG and BMD after 11/04/14.  We will schedule for pt. return annually or prn     An After Visit Summary was printed and given to the patient.

## 2014-09-13 LAB — LIPID PANEL
Cholesterol: 197 mg/dL (ref 0–200)
HDL: 77 mg/dL (ref >39)
LDL Cholesterol: 101 mg/dL — ABNORMAL HIGH (ref 0–99)
Total CHOL/HDL Ratio: 2.6 Ratio
Triglycerides: 93 mg/dL (ref <150)
VLDL: 19 mg/dL (ref 0–40)

## 2014-09-13 LAB — CBC
HCT: 40.4 % (ref 36.0–46.0)
Hemoglobin: 14.3 g/dL (ref 12.0–15.0)
MCH: 32.7 pg (ref 26.0–34.0)
MCHC: 35.4 g/dL (ref 30.0–36.0)
MCV: 92.4 fL (ref 78.0–100.0)
Platelets: 302 10*3/uL (ref 150–400)
RBC: 4.37 MIL/uL (ref 3.87–5.11)
RDW: 13.3 % (ref 11.5–15.5)
WBC: 6.5 10*3/uL (ref 4.0–10.5)

## 2014-09-13 LAB — COMPREHENSIVE METABOLIC PANEL
ALT: 8 U/L (ref 0–35)
AST: 18 U/L (ref 0–37)
Albumin: 4.4 g/dL (ref 3.5–5.2)
Alkaline Phosphatase: 53 U/L (ref 39–117)
BUN: 18 mg/dL (ref 6–23)
CO2: 27 mEq/L (ref 19–32)
Calcium: 9.7 mg/dL (ref 8.4–10.5)
Chloride: 104 mEq/L (ref 96–112)
Creat: 0.83 mg/dL (ref 0.50–1.10)
Glucose, Bld: 100 mg/dL — ABNORMAL HIGH (ref 70–99)
Potassium: 4.2 mEq/L (ref 3.5–5.3)
Sodium: 140 mEq/L (ref 135–145)
Total Bilirubin: 0.5 mg/dL (ref 0.2–1.2)
Total Protein: 6.3 g/dL (ref 6.0–8.3)

## 2014-09-13 LAB — TSH: TSH: 1.524 u[IU]/mL (ref 0.350–4.500)

## 2014-09-13 LAB — VITAMIN D 25 HYDROXY (VIT D DEFICIENCY, FRACTURES): Vit D, 25-Hydroxy: 25 ng/mL — ABNORMAL LOW (ref 30–89)

## 2014-09-14 LAB — IPS PAP TEST WITH REFLEX TO HPV

## 2014-09-19 ENCOUNTER — Telehealth: Payer: Self-pay | Admitting: Obstetrics & Gynecology

## 2014-09-19 NOTE — Telephone Encounter (Signed)
Spoke with patient. Advised that she is scheduled with Dr Lynann Bologna 11.02.2015 @ 1030. Provided telephone # for her office. Patient agreeable. May need to call them to reschedule.

## 2014-09-25 ENCOUNTER — Encounter: Payer: Self-pay | Admitting: Obstetrics & Gynecology

## 2014-11-14 ENCOUNTER — Telehealth: Payer: Self-pay

## 2014-11-14 NOTE — Telephone Encounter (Signed)
Patient notified of BMD results from [facility]. Hard copy will be scanned in epic.//kn

## 2015-01-04 ENCOUNTER — Encounter: Payer: Self-pay | Admitting: Obstetrics & Gynecology

## 2015-01-04 ENCOUNTER — Encounter: Payer: Self-pay | Admitting: Internal Medicine

## 2015-03-02 ENCOUNTER — Ambulatory Visit (AMBULATORY_SURGERY_CENTER): Payer: Self-pay | Admitting: *Deleted

## 2015-03-02 VITALS — Ht 62.0 in | Wt 109.4 lb

## 2015-03-02 DIAGNOSIS — Z1211 Encounter for screening for malignant neoplasm of colon: Secondary | ICD-10-CM

## 2015-03-02 MED ORDER — MOVIPREP 100 G PO SOLR: 1.0000 | Freq: Once | ORAL | Status: DC

## 2015-03-02 NOTE — Progress Notes (Signed)
No allergies to eggs or soy No home oxygen No diet pills No past issues with sedation

## 2015-03-16 ENCOUNTER — Encounter: Payer: Self-pay | Admitting: Internal Medicine

## 2015-03-16 ENCOUNTER — Ambulatory Visit (AMBULATORY_SURGERY_CENTER): Payer: Medicare Other | Admitting: Internal Medicine

## 2015-03-16 VITALS — BP 130/73 | HR 72 | Temp 98.8°F | Resp 35 | Ht 62.0 in | Wt 109.0 lb

## 2015-03-16 DIAGNOSIS — Z1211 Encounter for screening for malignant neoplasm of colon: Secondary | ICD-10-CM | POA: Diagnosis not present

## 2015-03-16 DIAGNOSIS — I472 Ventricular tachycardia: Secondary | ICD-10-CM | POA: Diagnosis not present

## 2015-03-16 MED ORDER — SODIUM CHLORIDE 0.9 % IV SOLN: 500.0000 mL | INTRAVENOUS | Status: DC

## 2015-03-16 NOTE — Op Note (Signed)
[facility]  [address] [place], [id number]   COLONOSCOPY PROCEDURE REPORT  PATIENT: Lindsey, Robinson  MR#: [id number] BIRTHDATE: March 21, 1951 , 64  yrs. old GENDER: female ENDOSCOPIST: Lafayette Dragon, MD REFERRED BY:Dr [place]:  03/16/2015 PROCEDURE:   Colonoscopy, screening First Screening Colonoscopy - Avg.  risk and is 50 yrs.  old or older - No.  Prior Negative Screening - Now for repeat screening. 10 or more years since last screening  History of Adenoma - Now for follow-up colonoscopy & has been > or = to 3 yrs.  N/A ASA CLASS:   Class I INDICATIONS:Screening for colonic neoplasia, Colorectal Neoplasm Risk Assessment for this procedure is average risk, and prior colonoscopy in November 2004 was a normal exam. MEDICATIONS: Monitored anesthesia care and Propofol 200 mg IV  DESCRIPTION OF PROCEDURE:   After the risks benefits and alternatives of the procedure were thoroughly explained, informed consent was obtained.  The digital rectal exam revealed no abnormalities of the rectum.   The LB [id number]  endoscope was introduced through the anus and advanced to the cecum, which was identified by both the appendix and ileocecal valve. No adverse events experienced.   The quality of the prep was excellent. (MoviPrep was used)  The instrument was then slowly withdrawn as the colon was fully examined.      COLON FINDINGS: A normal appearing cecum, ileocecal valve, and appendiceal orifice were identified.  The ascending, transverse, descending, sigmoid colon, and rectum appeared unremarkable. Retroflexed views revealed no abnormalities. The time to cecum = 10.22 Withdrawal time = 6.04   The scope was withdrawn and the procedure completed. COMPLICATIONS: There were no immediate complications.  ENDOSCOPIC IMPRESSION: Normal colonoscopy  RECOMMENDATIONS: High fiber diet Recall colonoscopy in 10 years  eSigned:  Lafayette Dragon, MD  03/16/2015 10:55 AM   cc:

## 2015-03-16 NOTE — Patient Instructions (Signed)
YOU HAD AN ENDOSCOPIC PROCEDURE TODAY AT [facility]:   Refer to the procedure report that was given to you for any specific questions about what was found during the examination.  If the procedure report does not answer your questions, please call your gastroenterologist to clarify.  If you requested that your care partner not be given the details of your procedure findings, then the procedure report has been included in a sealed envelope for you to review at your convenience later.  YOU SHOULD EXPECT: Some feelings of bloating in the abdomen. Passage of more gas than usual.  Walking can help get rid of the air that was put into your GI tract during the procedure and reduce the bloating. If you had a lower endoscopy (such as a colonoscopy or flexible sigmoidoscopy) you may notice spotting of blood in your stool or on the toilet paper. If you underwent a bowel prep for your procedure, you may not have a normal bowel movement for a few days.  Please Note:  You might notice some irritation and congestion in your nose or some drainage.  This is from the oxygen used during your procedure.  There is no need for concern and it should clear up in a day or so.  SYMPTOMS TO REPORT IMMEDIATELY:   Following lower endoscopy (colonoscopy or flexible sigmoidoscopy):  Excessive amounts of blood in the stool  Significant tenderness or worsening of abdominal pains  Swelling of the abdomen that is new, acute  Fever of 100F or higher  For urgent or emergent issues, a gastroenterologist can be reached at any hour by calling [phone].  DIET: Your first meal following the procedure should be a small meal and then it is ok to progress to your normal diet. Heavy or fried foods are harder to digest and may make you feel nauseous or bloated.  Likewise, meals heavy in dairy and vegetables can increase bloating.  Drink plenty of fluids but you should avoid alcoholic beverages for 24 hours.  ACTIVITY:   You should plan to take it easy for the rest of today and you should NOT DRIVE or use heavy machinery until tomorrow (because of the sedation medicines used during the test).    FOLLOW UP: Our staff will call the number listed on your records the next business day following your procedure to check on you and address any questions or concerns that you may have regarding the information given to you following your procedure. If we do not reach you, we will leave a message.  However, if you are feeling well and you are not experiencing any problems, there is no need to return our call.  We will assume that you have returned to your regular daily activities without incident.  SIGNATURES/CONFIDENTIALITY: You and/or your care partner have signed paperwork which will be entered into your electronic medical record.  These signatures attest to the fact that that the information above on your After Visit Summary has been reviewed and is understood.  Full responsibility of the confidentiality of this discharge information lies with you and/or your care-partner.  Continue your normal medications  Please read over handout and follow a high fiber diet

## 2015-03-16 NOTE — Progress Notes (Signed)
TO recovery, report to Harden Mo, Therapist, sports, VSS

## 2015-03-19 ENCOUNTER — Telehealth: Payer: Self-pay

## 2015-03-19 NOTE — Telephone Encounter (Signed)
  Follow up Call-  Call back number 03/16/2015  Post procedure Call Back phone  # [phone]  Permission to leave phone message Yes     Patient questions:  Do you have a fever, pain , or abdominal swelling? No. Pain Score  0 *  Have you tolerated food without any problems? Yes.    Have you been able to return to your normal activities? Yes.    Do you have any questions about your discharge instructions: Diet   No. Medications  No. Follow up visit  No.  Do you have questions or concerns about your Care? No.  Actions: * If pain score is 4 or above: No action needed, pain <4.

## 2015-04-16 DIAGNOSIS — L812 Freckles: Secondary | ICD-10-CM | POA: Diagnosis not present

## 2015-04-16 DIAGNOSIS — D225 Melanocytic nevi of trunk: Secondary | ICD-10-CM | POA: Diagnosis not present

## 2015-04-16 DIAGNOSIS — D1801 Hemangioma of skin and subcutaneous tissue: Secondary | ICD-10-CM | POA: Diagnosis not present

## 2015-04-16 DIAGNOSIS — L821 Other seborrheic keratosis: Secondary | ICD-10-CM | POA: Diagnosis not present

## 2015-04-16 DIAGNOSIS — L853 Xerosis cutis: Secondary | ICD-10-CM | POA: Diagnosis not present

## 2015-04-16 DIAGNOSIS — L570 Actinic keratosis: Secondary | ICD-10-CM | POA: Diagnosis not present

## 2015-04-16 DIAGNOSIS — Z85828 Personal history of other malignant neoplasm of skin: Secondary | ICD-10-CM | POA: Diagnosis not present

## 2015-05-21 ENCOUNTER — Other Ambulatory Visit: Payer: Self-pay

## 2015-06-27 ENCOUNTER — Telehealth: Payer: Self-pay | Admitting: Obstetrics & Gynecology

## 2015-06-27 ENCOUNTER — Ambulatory Visit: Payer: Self-pay | Admitting: Obstetrics and Gynecology

## 2015-06-27 ENCOUNTER — Ambulatory Visit (INDEPENDENT_AMBULATORY_CARE_PROVIDER_SITE_OTHER): Payer: Medicare Other | Admitting: Nurse Practitioner

## 2015-06-27 ENCOUNTER — Encounter: Payer: Self-pay | Admitting: Nurse Practitioner

## 2015-06-27 VITALS — BP 118/76 | HR 60 | Ht 61.75 in | Wt 107.0 lb

## 2015-06-27 DIAGNOSIS — N63 Unspecified lump in breast: Secondary | ICD-10-CM

## 2015-06-27 DIAGNOSIS — N6310 Unspecified lump in the right breast, unspecified quadrant: Secondary | ICD-10-CM

## 2015-06-27 NOTE — Telephone Encounter (Signed)
Spoke with patient. Patient states that she has bilateral breast implants. Felt her right breast this morning and feel something "fiborous" in the right lower portion of the breast. Denies any pain or tenderness to the area. Denies swelling or redness. Advised will need to be seen in office for breast check. Patient is agreeable and verbalizes understanding. Appointment scheduled for today at 4 pm with Lindsey Cage, FNP. Patient is agreeable to date and time.   Routing to provider for final review. Patient agreeable to disposition. Will close encounter.   Patient aware provider will review message and nurse will return call if any additional advice or change of disposition.

## 2015-06-27 NOTE — Progress Notes (Signed)
   Subjective:   64 y.o. MW female Lindsey Robinson presents for evaluation of right breast irregularity. Onset of the symptoms was this am. Patient sought evaluation because of right breast implant feels more 'fibrous'.  Contributing factors include mom with breast CA (sounds like DCIS) and delayed child bearing. Denies any other symptoms.. Patient denies history of trauma, bites, or injuries. Last mammogram was 11/06/14 with 3 D with implants Bi rad 1.   Previous evaluation has included mammogram only.   Review of Systems A comprehensive review of systems was negative.SUBJECTIVE    Objective:   General appearance: alert, cooperative, appears stated age and no distress  Head: Normocephalic, without obvious abnormality, atraumatic Neck: no adenopathy, no carotid bruit, no JVD, supple, symmetrical, trachea midline and thyroid not enlarged, symmetric, no tenderness/mass/nodules Back: symmetric, no curvature. ROM normal. No CVA tenderness. Lungs: clear to auscultation bilaterally Breasts: normal appearance, no masses or tenderness, positive findings: implants bilaterally, irregular area under right breast implant on the right.  Dr Talbert Nan also checked pt. Heart: regular rate and rhythm Abdomen: normal findings: no masses palpable and no organomegaly    Assessment:   ASSESSMENT:Patient is diagnosed with breast implants wit irregularity on the right   Plan:   PLAN: The patient has a documented plan to follow with further care of diagnostic plan  2. PLAN: FOLLOW after diagnostic Mammo/ [place]

## 2015-06-27 NOTE — Progress Notes (Signed)
Patient is scheduled for 07/09/15 R 3D Breast Diagnostic Mammogram with implants and R Breast Ultrasound at [facility] on 07/09/15 at 0830 . Patient agreeable to time/date/location. Patient prefers to wait until 07/09/15 for appointment due to family coming into town. She is advised to call back to office with any concerns.

## 2015-06-27 NOTE — Telephone Encounter (Signed)
Patient says "I found some kind of abnormality in my breast". Please call ASAP. Last seen 09/12/14.

## 2015-06-27 NOTE — Patient Instructions (Signed)
Will schedule diagnostic mammogram and utrasound

## 2015-06-30 NOTE — Progress Notes (Signed)
Encounter reviewed by Dr. Lamis Behrmann Amundson C. Silva.  

## 2015-07-09 DIAGNOSIS — N644 Mastodynia: Secondary | ICD-10-CM | POA: Diagnosis not present

## 2015-07-20 ENCOUNTER — Telehealth: Payer: Self-pay | Admitting: Emergency Medicine

## 2015-07-20 NOTE — Telephone Encounter (Signed)
Called patient to schedule breast check. Patient seen 06/27/15 and diagnosed with R Breast Mass. [facility] 07/09/15. Per note on mammogram report from Dalene Seltzer Rolen-Grubb, [place] would like patient to come in for repeat breast check in one month.   Message left to return call to [place] at [phone].

## 2015-08-10 NOTE — Telephone Encounter (Signed)
Message left to return call to Iriana Artley at [phone].    

## 2015-08-23 ENCOUNTER — Encounter: Payer: Self-pay | Admitting: Emergency Medicine

## 2015-08-23 NOTE — Telephone Encounter (Signed)
OK to send letter, but the breast imaging is not scanned in to EPIC yet.

## 2015-08-23 NOTE — Telephone Encounter (Signed)
Message left to return call to Gudrun Axe at [phone].    

## 2015-08-23 NOTE — Telephone Encounter (Signed)
3 attempts to reach patient.  Letter written and routed to Dr. Quincy Simmonds for review.

## 2015-08-28 NOTE — Telephone Encounter (Signed)
Letter sent via certified [place] Mail on 08/27/15. Breast Imaging sent to scan.  Will close encounter.

## 2015-09-03 ENCOUNTER — Telehealth: Payer: Self-pay | Admitting: Obstetrics and Gynecology

## 2015-09-03 NOTE — Telephone Encounter (Signed)
Patient returned call. She has received letter regarding breast check. States that she can still feel palpable area. Agreeable to breast check after receiving letter.  Appointment made with Kem Boroughs, FNP 09/04/15 at 1600. Routing to provider for final review. Patient agreeable to disposition. Will close encounter.

## 2015-09-03 NOTE — Telephone Encounter (Signed)
Patient is returning a call to Tracy. °

## 2015-09-04 ENCOUNTER — Encounter: Payer: Self-pay | Admitting: Nurse Practitioner

## 2015-09-04 ENCOUNTER — Ambulatory Visit (INDEPENDENT_AMBULATORY_CARE_PROVIDER_SITE_OTHER): Payer: Medicare Other | Admitting: Nurse Practitioner

## 2015-09-04 VITALS — BP 112/70 | HR 68 | Ht 61.75 in | Wt 109.0 lb

## 2015-09-04 DIAGNOSIS — L821 Other seborrheic keratosis: Secondary | ICD-10-CM | POA: Diagnosis not present

## 2015-09-04 DIAGNOSIS — Z9882 Breast implant status: Secondary | ICD-10-CM

## 2015-09-04 DIAGNOSIS — L820 Inflamed seborrheic keratosis: Secondary | ICD-10-CM | POA: Diagnosis not present

## 2015-09-04 DIAGNOSIS — L570 Actinic keratosis: Secondary | ICD-10-CM | POA: Diagnosis not present

## 2015-09-04 DIAGNOSIS — L218 Other seborrheic dermatitis: Secondary | ICD-10-CM | POA: Diagnosis not present

## 2015-09-04 DIAGNOSIS — N6310 Unspecified lump in the right breast, unspecified quadrant: Secondary | ICD-10-CM

## 2015-09-04 DIAGNOSIS — N63 Unspecified lump in breast: Secondary | ICD-10-CM

## 2015-09-04 DIAGNOSIS — Z85828 Personal history of other malignant neoplasm of skin: Secondary | ICD-10-CM | POA: Diagnosis not present

## 2015-09-04 DIAGNOSIS — I788 Other diseases of capillaries: Secondary | ICD-10-CM | POA: Diagnosis not present

## 2015-09-04 DIAGNOSIS — B078 Other viral warts: Secondary | ICD-10-CM | POA: Diagnosis not present

## 2015-09-04 DIAGNOSIS — D485 Neoplasm of uncertain behavior of skin: Secondary | ICD-10-CM | POA: Diagnosis not present

## 2015-09-04 NOTE — Patient Instructions (Signed)
Mammogram A mammogram is an X-ray of the breasts that is done to check for abnormal changes. This procedure can screen for and detect any changes that may suggest breast cancer. A mammogram can also identify other changes and variations in the breast, such as:  Inflammation of the breast tissue (mastitis).  An infected area that contains a collection of pus (abscess).  A fluid-filled sac (cyst).  Fibrocystic changes. This is when breast tissue becomes denser, which can make the tissue feel rope-like or uneven under the skin.  Tumors that are not cancerous (benign). LET YOUR HEALTH CARE PROVIDER KNOW ABOUT:  Any allergies you have.  If you have breast implants.  If you have had previous breast disease, biopsy, or surgery.  If you are breastfeeding.  Any possibility that you could be pregnant, if this applies.  If you are younger than age 25.  If you have a family history of breast cancer. RISKS AND COMPLICATIONS Generally, this is a safe procedure. However, problems may occur, including:  Exposure to radiation. Radiation levels are very low with this test.  The results being misinterpreted.  The need for further tests.  The inability of the mammogram to detect certain cancers. BEFORE THE PROCEDURE  Schedule your test about 1-2 weeks after your menstrual period. This is usually when your breasts are the least tender.  If you have had a mammogram done at a different facility in the past, get the mammogram X-rays or have them sent to your current exam facility in order to compare them.  Wash your breasts and under your arms the day of the test.  Do not wear deodorants, perfumes, lotions, or powders anywhere on your body on the day of the test.  Remove any jewelry from your neck.  Wear clothes that you can change into and out of easily. PROCEDURE  You will undress from the waist up and put on a gown.  You will stand in front of the X-ray machine.  Each breast will  be placed between two plastic or glass plates. The plates will compress your breast for a few seconds. Try to stay as relaxed as possible during the procedure. This does not cause any harm to your breasts and any discomfort you feel will be very brief.  X-rays will be taken from different angles of each breast. The procedure may vary among health care providers and hospitals. AFTER THE PROCEDURE  The mammogram will be examined by a specialist (radiologist).  You may need to repeat certain parts of the test, depending on the quality of the images. This is commonly done if the radiologist needs a better view of the breast tissue.  Ask when your test results will be ready. Make sure you get your test results.  You may resume your normal activities.   This information is not intended to replace advice given to you by your health care provider. Make sure you discuss any questions you have with your health care provider.   Document Released: 11/07/2000 Document Revised: 08/01/2015 Document Reviewed: 01/19/2015 Elsevier Interactive Patient Education 2016 [employer]  

## 2015-09-04 NOTE — Progress Notes (Signed)
Subjective:     Patient ID: Lindsey Robinson, female   DOB: 29-Jun-1951, 64 y.o.   MRN: [id number]  HPI 64 y.o. MW female Budd Palmer presents for a recheck on right breast.  She hadevaluation of right breast irregularity on 06/27/15.   Her onset of the symptoms with right breast irregularity was that am with her right breast implant feels more 'fibrous'. Contributing factors include mom with breast CA (sounds like DCIS) and delayed child bearing.  Prior mammogram was 11/06/14 with 3 D with implants Bi rad 1. This last mammogram on the right with breast US was normal.  She comes back now per our request for a recheck.  No problems in the interim and no further fibrous feelings on the right breast.  Review of Systems  HENT: Negative.   Respiratory: Negative.   Cardiovascular: Negative.   Gastrointestinal: Negative.   Genitourinary: Negative.   Musculoskeletal: Negative.   Skin: Negative.   Neurological: Negative.   Psychiatric/Behavioral: Negative.        Objective:   Physical Exam  Constitutional: She is oriented to person, place, and time. She appears well-developed and well-nourished. No distress.  Abdominal: Soft. She exhibits no distension. There is no tenderness. There is no rebound and no guarding.  Genitourinary:  Bilateral breast exam without mass.  There are implants bilateral without changes.  She has no further 'fibrous' type changes of the right breast at 6:00 position.    Neurological: She is alert and oriented to person, place, and time.  Skin: Skin is warm and dry.  Psychiatric: She has a normal mood and affect. Her behavior is normal. Judgment and thought content normal.       Assessment:     S/P bilateral breast implants History of suspected changes on the right with concerns about implant and normal Mammogram 8/15/16on the right - for left mammo in 12/16    Plan:     Watchful waiting and no other intervention at this time AEX is due 10/2015 with Dr. Sabra Heck

## 2015-09-06 NOTE — Progress Notes (Signed)
Encounter reviewed by Dr. Blia Totman Amundson C. Silva.  

## 2015-10-25 DIAGNOSIS — D2372 Other benign neoplasm of skin of left lower limb, including hip: Secondary | ICD-10-CM | POA: Diagnosis not present

## 2015-10-25 DIAGNOSIS — Z85828 Personal history of other malignant neoplasm of skin: Secondary | ICD-10-CM | POA: Diagnosis not present

## 2015-10-25 DIAGNOSIS — L603 Nail dystrophy: Secondary | ICD-10-CM | POA: Diagnosis not present

## 2015-10-25 DIAGNOSIS — L821 Other seborrheic keratosis: Secondary | ICD-10-CM | POA: Diagnosis not present

## 2015-10-25 DIAGNOSIS — L812 Freckles: Secondary | ICD-10-CM | POA: Diagnosis not present

## 2015-10-25 DIAGNOSIS — D225 Melanocytic nevi of trunk: Secondary | ICD-10-CM | POA: Diagnosis not present

## 2015-10-25 DIAGNOSIS — D692 Other nonthrombocytopenic purpura: Secondary | ICD-10-CM | POA: Diagnosis not present

## 2015-10-25 DIAGNOSIS — L578 Other skin changes due to chronic exposure to nonionizing radiation: Secondary | ICD-10-CM | POA: Diagnosis not present

## 2015-11-08 DIAGNOSIS — Z1231 Encounter for screening mammogram for malignant neoplasm of breast: Secondary | ICD-10-CM | POA: Diagnosis not present

## 2015-11-09 ENCOUNTER — Telehealth: Payer: Self-pay | Admitting: Obstetrics & Gynecology

## 2015-11-09 ENCOUNTER — Ambulatory Visit: Payer: Medicare Other | Admitting: Obstetrics & Gynecology

## 2015-11-09 NOTE — Telephone Encounter (Signed)
Patient says she is returning Sally's call. °

## 2015-11-09 NOTE — Telephone Encounter (Signed)
Patient returned call and she is scheduled for annual exam with Dr. Sabra Heck for 11/12/15 at 1115. Routing to provider for final review. Patient agreeable to disposition. Will close encounter.

## 2015-11-09 NOTE — Telephone Encounter (Signed)
Message left to return call to [place] at [phone].   Patient needs annual exam with Dr. Sabra Heck at her convenience.

## 2015-11-12 ENCOUNTER — Ambulatory Visit (INDEPENDENT_AMBULATORY_CARE_PROVIDER_SITE_OTHER): Payer: Medicare Other | Admitting: Obstetrics & Gynecology

## 2015-11-12 ENCOUNTER — Encounter: Payer: Self-pay | Admitting: Obstetrics & Gynecology

## 2015-11-12 VITALS — BP 132/80 | HR 60 | Resp 18 | Ht 62.0 in | Wt 109.0 lb

## 2015-11-12 DIAGNOSIS — Z01419 Encounter for gynecological examination (general) (routine) without abnormal findings: Secondary | ICD-10-CM

## 2015-11-12 DIAGNOSIS — Z124 Encounter for screening for malignant neoplasm of cervix: Secondary | ICD-10-CM | POA: Diagnosis not present

## 2015-11-12 MED ORDER — VITAMIN D (ERGOCALCIFEROL) 1.25 MG (50000 UNIT) PO CAPS: ORAL_CAPSULE | ORAL | Status: DC

## 2015-11-12 MED ORDER — ESTRADIOL 10 MCG VA TABS: ORAL_TABLET | VAGINAL | Status: DC

## 2015-11-12 MED ORDER — VALACYCLOVIR HCL 500 MG PO TABS: ORAL_TABLET | ORAL | Status: DC

## 2015-11-12 NOTE — Progress Notes (Signed)
64 y.o. [id number] MarriedCaucasianF here for annual exam.  Pt reports just having her routine MMG last week.  She was notified this was normal via email.  I haven't gotten this report year.   Denies vaginal bleeding.    PCP:  Dr. Felipa Eth  Patient's last menstrual period was 02/23/2008 (approximate).          Sexually active: No.  The current method of family planning is post menopausal status.    Exercising: Yes.    Walking, weights Smoker:  no  Health Maintenance: Pap:  09/12/14 Neg  History of abnormal Pap:  Yes. MMG:  07/09/15 [place] Right BIRADS1:neg Colonoscopy: 03/16/15 Normal - Repeat 10 years, Dr. Olevia Perches. BMD:   11/06/14 , -1.0 TDaP:  01/2008  Screening Labs: PCP, Hb today: PCP, Urine today: PCP   reports that she has never smoked. She has never used smokeless tobacco. She reports that she drinks about 8.4 oz of alcohol per week. She reports that she does not use illicit drugs.  Past Medical History  Diagnosis Date  . Arthritis   . Carpal tunnel syndrome   . HSV-2 infection   . Fibroids   . Urge incontinence of urine   . PAT (paroxysmal atrial tachycardia) ([place]) 9/14  . Cataract     right eye 04/2014  . Trigger finger     Past Surgical History  Procedure Laterality Date  . Great toe arthrodesis, interphalangeal joint  2007    rt  . Tonsillectomy  12/07    T&A  . Facial cosmetic surgery  2005  . Carpal tunnel release  10/26/2012    Procedure: CARPAL TUNNEL RELEASE;  Surgeon: Cammie Sickle., MD;  Location: [facility];  Service: Orthopedics;  Laterality: Left;  . Breast enhancement surgery  1973  . Hysteroscopy with resectoscope  6/09       . Cataract extraction  2013  . Cataract extraction  6/15  . Colonoscopy      Current Outpatient Prescriptions  Medication Sig Dispense Refill  . Estradiol (VAGIFEM) 10 MCG TABS vaginal tablet PLACE 1 TABLET VAGINALLY 2 TIMES A WEEK 24 tablet 4  . ketoconazole (NIZORAL) 2 % shampoo     . naproxen sodium  (ANAPROX) 220 MG tablet Take 220 mg by mouth 2 (two) times daily with a meal.    . RESTASIS 0.05 % ophthalmic emulsion     . valACYclovir (VALTREX) 500 MG tablet TAKE ONE TABLET DAILY 90 tablet 4  . Vitamin D, Ergocalciferol, (DRISDOL) 50000 UNITS CAPS capsule Once monthly 3 capsule 4   No current facility-administered medications for this visit.    Family History  Problem Relation Age of Onset  . Diabetes Paternal Aunt   . Brain cancer Maternal Grandmother   . Hypertension Mother   . Asthma Daughter   . Kidney Stones Father   . Colon cancer Neg Hx     ROS:  Pertinent items are noted in HPI.  Otherwise, a comprehensive ROS was negative.  Exam:   BP 132/80 mmHg  Pulse 60  Resp 18  Ht 5\' 2"  (1.575 m)  Wt 109 lb (49.442 kg)  BMI 19.93 kg/m2  LMP 02/23/2008 (Approximate)  Weight change: +3#    Height: 5\' 2"  (157.5 cm)  Ht Readings from Last 3 Encounters:  11/12/15 5\' 2"  (1.575 m)  09/04/15 5' 1.75" (1.568 m)  06/27/15 5' 1.75" (1.568 m)    General appearance: alert, cooperative and appears stated age Head: Normocephalic, without obvious abnormality,  atraumatic Neck: no adenopathy, supple, symmetrical, trachea midline and thyroid normal to inspection and palpation Lungs: clear to auscultation bilaterally Breasts: normal appearance, no masses or tenderness , bilateral implants present Heart: regular rate and rhythm Abdomen: soft, non-tender; bowel sounds normal; no masses,  no organomegaly Extremities: extremities normal, atraumatic, no cyanosis or edema Skin: Skin color, texture, turgor normal. No rashes or lesions Lymph nodes: Cervical, supraclavicular, and axillary nodes normal. No abnormal inguinal nodes palpated Neurologic: Grossly normal   Pelvic: External genitalia:  no lesions              Urethra:  normal appearing urethra with no masses, tenderness or lesions              Bartholins and Skenes: normal                 Vagina: normal appearing vagina with normal  color and discharge, no lesions              Cervix: no lesions              Pap taken: No. Bimanual Exam:  Uterus:  normal size, contour, position, consistency, mobility, non-tender              Adnexa: normal adnexa and no mass, fullness, tenderness               Rectovaginal: Confirms               Anus:  normal sphincter tone, no lesions  Chaperone was present for exam.  A:  Well Woman with normal exam  PMP, no HRT  H/O HSV 2  Atrophic changes  Breast implants  P: Mammogram yearly  pap smear with neg HR HPV 7/13. Pap negative 2015.  No pap today.   Labs will be done with Dr. Felipa Eth this summer at visit.  Copies of last ones given per pt request Vagifem 66meq pv twice weekly.  Rx to pharmacy. Valtrex 500mg  qday.  #90/4RF.  Rx to pharamcy  return annually or prn

## 2015-11-22 DIAGNOSIS — H2513 Age-related nuclear cataract, bilateral: Secondary | ICD-10-CM | POA: Diagnosis not present

## 2015-11-22 DIAGNOSIS — H338 Other retinal detachments: Secondary | ICD-10-CM | POA: Diagnosis not present

## 2015-11-22 DIAGNOSIS — Z85828 Personal history of other malignant neoplasm of skin: Secondary | ICD-10-CM | POA: Diagnosis not present

## 2015-11-22 DIAGNOSIS — L309 Dermatitis, unspecified: Secondary | ICD-10-CM | POA: Diagnosis not present

## 2016-01-04 DIAGNOSIS — L821 Other seborrheic keratosis: Secondary | ICD-10-CM | POA: Diagnosis not present

## 2016-01-04 DIAGNOSIS — L218 Other seborrheic dermatitis: Secondary | ICD-10-CM | POA: Diagnosis not present

## 2016-01-04 DIAGNOSIS — L720 Epidermal cyst: Secondary | ICD-10-CM | POA: Diagnosis not present

## 2016-01-10 DIAGNOSIS — H16223 Keratoconjunctivitis sicca, not specified as Sjogren's, bilateral: Secondary | ICD-10-CM | POA: Diagnosis not present

## 2016-04-28 DIAGNOSIS — M79641 Pain in right hand: Secondary | ICD-10-CM | POA: Diagnosis not present

## 2016-04-28 DIAGNOSIS — M65311 Trigger thumb, right thumb: Secondary | ICD-10-CM | POA: Diagnosis not present

## 2016-04-28 DIAGNOSIS — M1811 Unilateral primary osteoarthritis of first carpometacarpal joint, right hand: Secondary | ICD-10-CM | POA: Diagnosis not present

## 2016-05-12 DIAGNOSIS — F5101 Primary insomnia: Secondary | ICD-10-CM | POA: Diagnosis not present

## 2016-05-12 DIAGNOSIS — Z79899 Other long term (current) drug therapy: Secondary | ICD-10-CM | POA: Diagnosis not present

## 2016-05-12 DIAGNOSIS — I471 Supraventricular tachycardia: Secondary | ICD-10-CM | POA: Diagnosis not present

## 2016-05-12 DIAGNOSIS — Z23 Encounter for immunization: Secondary | ICD-10-CM | POA: Diagnosis not present

## 2016-05-12 DIAGNOSIS — Z1389 Encounter for screening for other disorder: Secondary | ICD-10-CM | POA: Diagnosis not present

## 2016-05-12 DIAGNOSIS — Z0000 Encounter for general adult medical examination without abnormal findings: Secondary | ICD-10-CM | POA: Diagnosis not present

## 2016-05-22 DIAGNOSIS — M65311 Trigger thumb, right thumb: Secondary | ICD-10-CM | POA: Diagnosis not present

## 2016-05-22 DIAGNOSIS — M79644 Pain in right finger(s): Secondary | ICD-10-CM | POA: Diagnosis not present

## 2016-05-22 DIAGNOSIS — M1811 Unilateral primary osteoarthritis of first carpometacarpal joint, right hand: Secondary | ICD-10-CM | POA: Diagnosis not present

## 2016-07-25 DIAGNOSIS — D485 Neoplasm of uncertain behavior of skin: Secondary | ICD-10-CM | POA: Diagnosis not present

## 2016-07-25 DIAGNOSIS — L2089 Other atopic dermatitis: Secondary | ICD-10-CM | POA: Diagnosis not present

## 2016-07-25 DIAGNOSIS — Z85828 Personal history of other malignant neoplasm of skin: Secondary | ICD-10-CM | POA: Diagnosis not present

## 2016-07-29 DIAGNOSIS — M1811 Unilateral primary osteoarthritis of first carpometacarpal joint, right hand: Secondary | ICD-10-CM | POA: Diagnosis not present

## 2016-07-29 DIAGNOSIS — G8918 Other acute postprocedural pain: Secondary | ICD-10-CM | POA: Diagnosis not present

## 2016-07-29 DIAGNOSIS — M65311 Trigger thumb, right thumb: Secondary | ICD-10-CM | POA: Diagnosis not present

## 2016-07-29 DIAGNOSIS — M13131 Monoarthritis, not elsewhere classified, right wrist: Secondary | ICD-10-CM | POA: Diagnosis not present

## 2016-07-29 DIAGNOSIS — G5601 Carpal tunnel syndrome, right upper limb: Secondary | ICD-10-CM | POA: Diagnosis not present

## 2016-07-29 HISTORY — PX: JOINT REPLACEMENT: SHX530

## 2016-08-01 DIAGNOSIS — Z85828 Personal history of other malignant neoplasm of skin: Secondary | ICD-10-CM | POA: Diagnosis not present

## 2016-08-01 DIAGNOSIS — C4401 Basal cell carcinoma of skin of lip: Secondary | ICD-10-CM | POA: Diagnosis not present

## 2016-08-01 DIAGNOSIS — D485 Neoplasm of uncertain behavior of skin: Secondary | ICD-10-CM | POA: Diagnosis not present

## 2016-08-01 DIAGNOSIS — C44319 Basal cell carcinoma of skin of other parts of face: Secondary | ICD-10-CM | POA: Diagnosis not present

## 2016-08-11 DIAGNOSIS — M1811 Unilateral primary osteoarthritis of first carpometacarpal joint, right hand: Secondary | ICD-10-CM | POA: Diagnosis not present

## 2016-08-11 DIAGNOSIS — Z4789 Encounter for other orthopedic aftercare: Secondary | ICD-10-CM | POA: Diagnosis not present

## 2016-08-11 DIAGNOSIS — G5601 Carpal tunnel syndrome, right upper limb: Secondary | ICD-10-CM | POA: Diagnosis not present

## 2016-08-11 DIAGNOSIS — M65311 Trigger thumb, right thumb: Secondary | ICD-10-CM | POA: Diagnosis not present

## 2016-08-24 DIAGNOSIS — C4401 Basal cell carcinoma of skin of lip: Secondary | ICD-10-CM | POA: Insufficient documentation

## 2016-08-25 DIAGNOSIS — Z4789 Encounter for other orthopedic aftercare: Secondary | ICD-10-CM | POA: Diagnosis not present

## 2016-08-25 DIAGNOSIS — L2089 Other atopic dermatitis: Secondary | ICD-10-CM | POA: Diagnosis not present

## 2016-08-25 DIAGNOSIS — M1811 Unilateral primary osteoarthritis of first carpometacarpal joint, right hand: Secondary | ICD-10-CM | POA: Diagnosis not present

## 2016-08-27 DIAGNOSIS — L2089 Other atopic dermatitis: Secondary | ICD-10-CM | POA: Diagnosis not present

## 2016-08-29 DIAGNOSIS — L2089 Other atopic dermatitis: Secondary | ICD-10-CM | POA: Diagnosis not present

## 2016-09-01 DIAGNOSIS — L2089 Other atopic dermatitis: Secondary | ICD-10-CM | POA: Diagnosis not present

## 2016-09-08 DIAGNOSIS — M1811 Unilateral primary osteoarthritis of first carpometacarpal joint, right hand: Secondary | ICD-10-CM | POA: Diagnosis not present

## 2016-09-08 DIAGNOSIS — Z4789 Encounter for other orthopedic aftercare: Secondary | ICD-10-CM | POA: Diagnosis not present

## 2016-09-16 DIAGNOSIS — M1811 Unilateral primary osteoarthritis of first carpometacarpal joint, right hand: Secondary | ICD-10-CM | POA: Diagnosis not present

## 2016-09-24 HISTORY — PX: MOHS SURGERY: SHX181

## 2016-10-02 DIAGNOSIS — M1811 Unilateral primary osteoarthritis of first carpometacarpal joint, right hand: Secondary | ICD-10-CM | POA: Diagnosis not present

## 2016-10-06 DIAGNOSIS — Z85828 Personal history of other malignant neoplasm of skin: Secondary | ICD-10-CM | POA: Diagnosis not present

## 2016-10-06 DIAGNOSIS — C4401 Basal cell carcinoma of skin of lip: Secondary | ICD-10-CM | POA: Diagnosis not present

## 2016-10-09 DIAGNOSIS — M545 Low back pain: Secondary | ICD-10-CM | POA: Diagnosis not present

## 2016-10-09 DIAGNOSIS — M1811 Unilateral primary osteoarthritis of first carpometacarpal joint, right hand: Secondary | ICD-10-CM | POA: Diagnosis not present

## 2016-10-09 DIAGNOSIS — Z4789 Encounter for other orthopedic aftercare: Secondary | ICD-10-CM | POA: Diagnosis not present

## 2016-10-21 DIAGNOSIS — M545 Low back pain: Secondary | ICD-10-CM | POA: Diagnosis not present

## 2016-10-23 DIAGNOSIS — M1811 Unilateral primary osteoarthritis of first carpometacarpal joint, right hand: Secondary | ICD-10-CM | POA: Diagnosis not present

## 2016-10-23 DIAGNOSIS — L820 Inflamed seborrheic keratosis: Secondary | ICD-10-CM | POA: Diagnosis not present

## 2016-10-23 DIAGNOSIS — L309 Dermatitis, unspecified: Secondary | ICD-10-CM | POA: Diagnosis not present

## 2016-10-23 DIAGNOSIS — L812 Freckles: Secondary | ICD-10-CM | POA: Diagnosis not present

## 2016-10-23 DIAGNOSIS — Z85828 Personal history of other malignant neoplasm of skin: Secondary | ICD-10-CM | POA: Diagnosis not present

## 2016-10-23 DIAGNOSIS — L821 Other seborrheic keratosis: Secondary | ICD-10-CM | POA: Diagnosis not present

## 2016-11-10 DIAGNOSIS — Z1231 Encounter for screening mammogram for malignant neoplasm of breast: Secondary | ICD-10-CM | POA: Diagnosis not present

## 2016-11-10 DIAGNOSIS — Z803 Family history of malignant neoplasm of breast: Secondary | ICD-10-CM | POA: Diagnosis not present

## 2016-11-10 DIAGNOSIS — M8589 Other specified disorders of bone density and structure, multiple sites: Secondary | ICD-10-CM | POA: Diagnosis not present

## 2016-11-13 ENCOUNTER — Ambulatory Visit (INDEPENDENT_AMBULATORY_CARE_PROVIDER_SITE_OTHER): Payer: Medicare Other | Admitting: Obstetrics & Gynecology

## 2016-11-13 ENCOUNTER — Encounter: Payer: Self-pay | Admitting: Obstetrics & Gynecology

## 2016-11-13 VITALS — BP 122/70 | HR 72 | Resp 16 | Ht 61.75 in | Wt 106.0 lb

## 2016-11-13 DIAGNOSIS — L659 Nonscarring hair loss, unspecified: Secondary | ICD-10-CM

## 2016-11-13 DIAGNOSIS — Z205 Contact with and (suspected) exposure to viral hepatitis: Secondary | ICD-10-CM

## 2016-11-13 DIAGNOSIS — Z01419 Encounter for gynecological examination (general) (routine) without abnormal findings: Secondary | ICD-10-CM

## 2016-11-13 DIAGNOSIS — Z124 Encounter for screening for malignant neoplasm of cervix: Secondary | ICD-10-CM

## 2016-11-13 LAB — FERRITIN: Ferritin: 131 ng/mL (ref 20–288)

## 2016-11-13 MED ORDER — VITAMIN D (ERGOCALCIFEROL) 1.25 MG (50000 UNIT) PO CAPS: 50000.0000 [IU] | ORAL_CAPSULE | ORAL | 4 refills | Status: DC

## 2016-11-13 MED ORDER — VALACYCLOVIR HCL 500 MG PO TABS: ORAL_TABLET | ORAL | 4 refills | Status: DC

## 2016-11-13 MED ORDER — ESTRADIOL 10 MCG VA TABS: ORAL_TABLET | VAGINAL | 4 refills | Status: DC

## 2016-11-13 NOTE — Addendum Note (Signed)
Addended by: Megan Salon on: 11/13/2016 12:22 PM   Modules accepted: Orders

## 2016-11-13 NOTE — Progress Notes (Signed)
65 y.o. [id number] MarriedCaucasianF here for annual exam.  Doing well.  Continues to have vaginal atrophic changes and does have pain with intercourse.  PCP:  Dr. Felipa Eth.  Had appt in June.    Patient's last menstrual period was 02/23/2008 (approximate).          Sexually active: No.  The current method of family planning is post menopausal status.    Exercising: Yes.    walking, occasional weights Smoker:  no  Health Maintenance: Pap:  09/12/14 negative  History of abnormal Pap:  yes MMG:  11/08/15 BIRADS 2 benign  Colonoscopy:  03/16/15 normal- repeat 10 years, Dr. Olevia Perches BMD:   11/06/14 osteopenia- repeat 5 years TDaP:  03/09  Pneumonia vaccine(s):  PCP Zostavax:   PCP Hep C testing: doing todayay Screening Labs: PCP, Hb today: PCP, Urine today: PCP   reports that she has never smoked. She has never used smokeless tobacco. She reports that she drinks about 8.4 oz of alcohol per week . She reports that she does not use drugs.  Past Medical History:  Diagnosis Date  . Arthritis   . Carpal tunnel syndrome   . Cataract    right eye 04/2014  . Fibroids   . HSV-2 infection   . PAT (paroxysmal atrial tachycardia) ([place]) 9/14  . Trigger finger   . Urge incontinence of urine     Past Surgical History:  Procedure Laterality Date  . BREAST ENHANCEMENT SURGERY  1973  . CARPAL TUNNEL RELEASE  10/26/2012   Procedure: CARPAL TUNNEL RELEASE;  Surgeon: Cammie Sickle., MD;  Location: [facility];  Service: Orthopedics;  Laterality: Left;  . CATARACT EXTRACTION  2013  . CATARACT EXTRACTION  6/15  . COLONOSCOPY    . FACIAL COSMETIC SURGERY  2005  . GREAT TOE ARTHRODESIS, INTERPHALANGEAL JOINT  2007   rt  . HYSTEROSCOPY WITH RESECTOSCOPE  6/09      . TONSILLECTOMY  12/07   T&A    Current Outpatient Prescriptions  Medication Sig Dispense Refill  . clobetasol cream (TEMOVATE) [id number] % Apply 1 application topically as needed.     . Estradiol (VAGIFEM) 10 MCG TABS  vaginal tablet PLACE 1 TABLET VAGINALLY 2 TIMES A WEEK 24 tablet 4  . ketoconazole (NIZORAL) 2 % shampoo     . naproxen sodium (ANAPROX) 220 MG tablet Take 220 mg by mouth 2 (two) times daily with a meal.    . predniSONE (DELTASONE) 10 MG tablet Take 1 tablet by mouth as directed.    . RESTASIS 0.05 % ophthalmic emulsion     . triamcinolone cream (KENALOG) 0.1 % as needed.    . valACYclovir (VALTREX) 500 MG tablet TAKE ONE TABLET DAILY 90 tablet 4  . Vitamin D, Ergocalciferol, (DRISDOL) 50000 UNITS CAPS capsule Once monthly 3 capsule 4   No current facility-administered medications for this visit.     Family History  Problem Relation Age of Onset  . Hypertension Mother   . Kidney Stones Father   . Diabetes Paternal Aunt   . Brain cancer Maternal Grandmother   . Asthma Daughter   . Colon cancer Neg Hx     ROS:  Pertinent items are noted in HPI.  Otherwise, a comprehensive ROS was negative.  Exam:   BP 122/70 (BP Location: Right Arm, Patient Position: Sitting, Cuff Size: Normal)   Pulse 72   Resp 16   Ht 5' 1.75" (1.568 m)   Wt 106 lb (48.1 kg)  LMP 02/23/2008 (Approximate)   BMI 19.54 kg/m     Height: 5' 1.75" (156.8 cm)  Ht Readings from Last 3 Encounters:  11/13/16 5' 1.75" (1.568 m)  11/12/15 5\' 2"  (1.575 m)  09/04/15 5' 1.75" (1.568 m)   General appearance: alert, cooperative and appears stated age Head: Normocephalic, without obvious abnormality, atraumatic Neck: no adenopathy, supple, symmetrical, trachea midline and thyroid normal to inspection and palpation Lungs: clear to auscultation bilaterally Breasts: normal appearance, no masses or tenderness, bilateral implants Heart: regular rate and rhythm Abdomen: soft, non-tender; bowel sounds normal; no masses,  no organomegaly Extremities: extremities normal, atraumatic, no cyanosis or edema Skin: Skin color, texture, turgor normal. No rashes or lesions Lymph nodes: Cervical, supraclavicular, and axillary nodes  normal. No abnormal inguinal nodes palpated Neurologic: Grossly normal   Pelvic: External genitalia:  no lesions              Urethra:  normal appearing urethra with no masses, tenderness or lesions              Bartholins and Skenes: normal                 Vagina: normal appearing vagina with normal color and discharge, no lesions              Cervix: no lesions              Pap taken: Yes.   Bimanual Exam:  Uterus:  normal size, contour, position, consistency, mobility, non-tender              Adnexa: normal adnexa and no mass, fullness, tenderness               Rectovaginal: Confirms               Anus:  normal sphincter tone, no lesions  Chaperone was present for exam.  A:  Well Woman with normal exam  PMP, no HRT  H/O HSV 2   Atrophic changes  Breast implants BCC on lip this year Information about vaginal laser discussed and if she wants referral to Dr. Helane Rima, would be happy to do this.  P: Mammogram yearly  pap smear with neg HR HPV 7/13. Pap negative 2015. Pap obtained today. Labs will be done with Dr. Felipa Eth this summer at visit.  Release for these signed today. Vagifem 75meq pv twice weekly.  Rx to pharmacy. Valtrex 500mg  qday.  #90/4RF.  Rx to pharamcy  On Vit D 50K monthly.  #3/4RF Hep C antibody obtained today Ferritin level will be obtained today as well Return annually or prn

## 2016-11-14 DIAGNOSIS — L309 Dermatitis, unspecified: Secondary | ICD-10-CM | POA: Diagnosis not present

## 2016-11-14 DIAGNOSIS — Z85828 Personal history of other malignant neoplasm of skin: Secondary | ICD-10-CM | POA: Diagnosis not present

## 2016-11-14 LAB — PAP IG (IMAGE GUIDED)

## 2016-11-14 LAB — HEPATITIS C ANTIBODY: HCV Ab: NEGATIVE

## 2016-11-19 ENCOUNTER — Encounter: Payer: Self-pay | Admitting: Obstetrics & Gynecology

## 2016-11-20 ENCOUNTER — Telehealth: Payer: Self-pay | Admitting: *Deleted

## 2016-11-20 NOTE — Telephone Encounter (Signed)
Message left to return call to Nirvaan Frett at [phone].    

## 2016-11-27 NOTE — Telephone Encounter (Signed)
Call to patient. BMD results reviewed with patient and she verbalized understanding. Results for pap smear, hep c and ferritin also reviewed with patient as she had not viewed via MyChart. Patient verbalized understanding.   Routing to provider for final review. Patient agreeable to disposition. Will close encounter.

## 2017-01-29 ENCOUNTER — Telehealth: Payer: Self-pay | Admitting: *Deleted

## 2017-01-29 NOTE — Telephone Encounter (Signed)
I spoke to the patient to advise we received notification from her pharmacy benefits provider stating they will no longer cover Vagifem 10 MCG tablets.  Patient states she has not received her letter stating this at this time.  Patient also says she received generic version with her last refill.  Alternative listed as Estrace Cream.    Patient prefers to wait to make any changes.  She will see how much her prescription will cost next month before making a decision.  If Vagifem or generic version is too expensive she will call our office.  Routing to provider for final review. Copy of letter placed in Dr. Ammie Ferrier folder.  Closing encounter.

## 2017-02-04 ENCOUNTER — Telehealth: Payer: Self-pay | Admitting: *Deleted

## 2017-02-04 MED ORDER — ESTRADIOL 0.1 MG/GM VA CREA: TOPICAL_CREAM | VAGINAL | 0 refills | Status: DC

## 2017-02-04 NOTE — Telephone Encounter (Signed)
Prescription for Estrace vaginal cream #42.5,0RF called in to [facility]. Call to patient. Left message per DPR letting patient know prescription called in and to call office to schedule 3 month recheck. Advised patient she could ask for Raquel Sarna, or front desk could help schedule recheck.

## 2017-02-04 NOTE — Telephone Encounter (Signed)
Ok to send in rx for estrace vaginal cream 1 gm pv up to twice weekly.  #42.5gm/0RF.  Probably should recheck 3 months to make sure not having issues.

## 2017-02-04 NOTE — Telephone Encounter (Signed)
Message left per DPR, to return call to [place] at [phone].

## 2017-02-04 NOTE — Telephone Encounter (Signed)
Patient returned call. Patient states she would like to go ahead and try the Estrace vaginal cream. Pharmacy on file verified.   Routing to provider for review.

## 2017-02-13 NOTE — Telephone Encounter (Signed)
Message left per DPR to return call to [facility] at [phone] to schedule 3 month follow up.

## 2017-02-18 DIAGNOSIS — J069 Acute upper respiratory infection, unspecified: Secondary | ICD-10-CM | POA: Diagnosis not present

## 2017-02-19 NOTE — Telephone Encounter (Signed)
Dr. Sabra Heck- I have attempted to contact this patient twice in regards to scheduling 3 month follow up. Patient has not returned call. Please advise.   Routing to provider for review.

## 2017-02-19 NOTE — Telephone Encounter (Signed)
Ok to close encounter. 

## 2017-02-23 NOTE — Telephone Encounter (Signed)
Encounter closed per Dr. Miller.  

## 2017-03-12 DIAGNOSIS — H16223 Keratoconjunctivitis sicca, not specified as Sjogren's, bilateral: Secondary | ICD-10-CM | POA: Diagnosis not present

## 2017-03-19 DIAGNOSIS — H16223 Keratoconjunctivitis sicca, not specified as Sjogren's, bilateral: Secondary | ICD-10-CM | POA: Diagnosis not present

## 2017-03-30 DIAGNOSIS — Z85828 Personal history of other malignant neoplasm of skin: Secondary | ICD-10-CM | POA: Diagnosis not present

## 2017-03-30 DIAGNOSIS — M1811 Unilateral primary osteoarthritis of first carpometacarpal joint, right hand: Secondary | ICD-10-CM | POA: Diagnosis not present

## 2017-03-30 DIAGNOSIS — M65311 Trigger thumb, right thumb: Secondary | ICD-10-CM | POA: Diagnosis not present

## 2017-03-30 DIAGNOSIS — L720 Epidermal cyst: Secondary | ICD-10-CM | POA: Diagnosis not present

## 2017-03-30 DIAGNOSIS — L738 Other specified follicular disorders: Secondary | ICD-10-CM | POA: Diagnosis not present

## 2017-03-30 DIAGNOSIS — G5601 Carpal tunnel syndrome, right upper limb: Secondary | ICD-10-CM | POA: Diagnosis not present

## 2017-03-30 HISTORY — PX: EYE SURGERY: SHX253

## 2017-03-31 ENCOUNTER — Ambulatory Visit (INDEPENDENT_AMBULATORY_CARE_PROVIDER_SITE_OTHER): Payer: Medicare Other | Admitting: Obstetrics & Gynecology

## 2017-03-31 ENCOUNTER — Encounter: Payer: Self-pay | Admitting: Obstetrics & Gynecology

## 2017-03-31 VITALS — BP 102/70 | HR 72 | Resp 16 | Wt 107.0 lb

## 2017-03-31 DIAGNOSIS — D1721 Benign lipomatous neoplasm of skin and subcutaneous tissue of right arm: Secondary | ICD-10-CM | POA: Diagnosis not present

## 2017-03-31 NOTE — Progress Notes (Signed)
GYNECOLOGY  VISIT   HPI: 66 y.o. [id number] Married Caucasian female here for complaint of right axillary lump that she's felt for 2 months.  She's been in [place] for the past two months.  She came home a couple of weeks ago.  Last MMG 11/10/16.  She did a 3D at that time.   Denies breast pain, skin changes, or nipple discharge.  Pt reports she "finally" started the Vagifem.  She has been in [place] and just didn't want the "mess with it there".  Not having any issues but can't really tell much difference.  Pt has just started, however.  GYNECOLOGIC HISTORY: Patient's last menstrual period was 02/23/2008 (approximate). Contraception: PMP Menopausal hormone therapy: none  Patient Active Problem List   Diagnosis Date Noted  . Basal cell carcinoma of skin of right upper lip 08/24/2016    Past Medical History:  Diagnosis Date  . Arthritis   . BCC (basal cell carcinoma of skin)    on lip. Had Mohs surgery.  . Carpal tunnel syndrome   . Cataract    right eye 04/2014  . Fibroids   . HSV-2 infection   . PAT (paroxysmal atrial tachycardia) ([place]) 9/14  . Trigger finger   . Urge incontinence of urine     Past Surgical History:  Procedure Laterality Date  . BREAST ENHANCEMENT SURGERY  1973  . CARPAL TUNNEL RELEASE  10/26/2012   Procedure: CARPAL TUNNEL RELEASE;  Surgeon: Cammie Sickle., MD;  Location: [facility];  Service: Orthopedics;  Laterality: Left;  . CATARACT EXTRACTION  2013  . CATARACT EXTRACTION  6/15  . COLONOSCOPY    . EYE SURGERY Right 03/30/2017   right eye lid BX  . FACIAL COSMETIC SURGERY  2005  . GREAT TOE ARTHRODESIS, INTERPHALANGEAL JOINT  2007   rt  . HYSTEROSCOPY WITH RESECTOSCOPE  6/09      . JOINT REPLACEMENT Right 07/29/2016   right thumb.  Dr. Amedeo Plenty.  Marland Kitchen MOHS SURGERY  09/2016   Dr. Sarajane Jews  . TONSILLECTOMY  12/07   T&A    MEDS:  Reviewed in EPIC and UTD  ALLERGIES: Patient has no known allergies.  Family History  Problem  Relation Age of Onset  . Hypertension Mother   . Kidney Stones Father   . Diabetes Paternal Aunt   . Brain cancer Maternal Grandmother   . Asthma Daughter   . Colon cancer Neg Hx     SH:  Married, non smoker  Review of Systems  All other systems reviewed and are negative.   PHYSICAL EXAMINATION:    BP 102/70 (BP Location: Right Arm, Patient Position: Sitting, Cuff Size: Normal)   Pulse 72   Resp 16   Wt 107 lb (48.5 kg)   LMP 02/23/2008 (Approximate)   BMI 19.73 kg/m     Physical Exam  Constitutional: She appears well-developed and well-nourished.  Neck: Normal range of motion. Neck supple. No thyromegaly present.  Cardiovascular: Normal rate and regular rhythm.   Respiratory: Effort normal and breath sounds normal. She exhibits mass. Right breast exhibits no inverted nipple, no mass, no nipple discharge, no skin change and no tenderness. Left breast exhibits no inverted nipple, no mass, no nipple discharge, no skin change and no tenderness. Breasts are symmetrical.    Lymphadenopathy:    She has no cervical adenopathy.    Assessment: Probably lipoma, no in or at breast or axilla  Plan: Recheck 3 months when pt returns for recheck after starting  Vagifem

## 2017-04-14 DIAGNOSIS — L570 Actinic keratosis: Secondary | ICD-10-CM | POA: Diagnosis not present

## 2017-04-14 DIAGNOSIS — D1801 Hemangioma of skin and subcutaneous tissue: Secondary | ICD-10-CM | POA: Diagnosis not present

## 2017-04-14 DIAGNOSIS — D225 Melanocytic nevi of trunk: Secondary | ICD-10-CM | POA: Diagnosis not present

## 2017-04-14 DIAGNOSIS — D2262 Melanocytic nevi of left upper limb, including shoulder: Secondary | ICD-10-CM | POA: Diagnosis not present

## 2017-04-14 DIAGNOSIS — Z85828 Personal history of other malignant neoplasm of skin: Secondary | ICD-10-CM | POA: Diagnosis not present

## 2017-04-27 DIAGNOSIS — H11122 Conjunctival concretions, left eye: Secondary | ICD-10-CM | POA: Diagnosis not present

## 2017-05-19 DIAGNOSIS — Z85828 Personal history of other malignant neoplasm of skin: Secondary | ICD-10-CM | POA: Diagnosis not present

## 2017-05-19 DIAGNOSIS — L821 Other seborrheic keratosis: Secondary | ICD-10-CM | POA: Diagnosis not present

## 2017-05-19 DIAGNOSIS — L718 Other rosacea: Secondary | ICD-10-CM | POA: Diagnosis not present

## 2017-05-19 DIAGNOSIS — L738 Other specified follicular disorders: Secondary | ICD-10-CM | POA: Diagnosis not present

## 2017-06-03 DIAGNOSIS — F5101 Primary insomnia: Secondary | ICD-10-CM | POA: Diagnosis not present

## 2017-06-03 DIAGNOSIS — Z79899 Other long term (current) drug therapy: Secondary | ICD-10-CM | POA: Diagnosis not present

## 2017-06-03 DIAGNOSIS — Z23 Encounter for immunization: Secondary | ICD-10-CM | POA: Diagnosis not present

## 2017-06-03 DIAGNOSIS — Z1389 Encounter for screening for other disorder: Secondary | ICD-10-CM | POA: Diagnosis not present

## 2017-06-03 DIAGNOSIS — Z0000 Encounter for general adult medical examination without abnormal findings: Secondary | ICD-10-CM | POA: Diagnosis not present

## 2017-06-30 ENCOUNTER — Ambulatory Visit: Payer: Medicare Other | Admitting: Obstetrics & Gynecology

## 2017-07-24 DIAGNOSIS — D485 Neoplasm of uncertain behavior of skin: Secondary | ICD-10-CM | POA: Diagnosis not present

## 2017-07-24 DIAGNOSIS — C44612 Basal cell carcinoma of skin of right upper limb, including shoulder: Secondary | ICD-10-CM | POA: Diagnosis not present

## 2017-07-24 DIAGNOSIS — L720 Epidermal cyst: Secondary | ICD-10-CM | POA: Diagnosis not present

## 2017-07-24 DIAGNOSIS — L739 Follicular disorder, unspecified: Secondary | ICD-10-CM | POA: Diagnosis not present

## 2017-07-24 DIAGNOSIS — Z85828 Personal history of other malignant neoplasm of skin: Secondary | ICD-10-CM | POA: Diagnosis not present

## 2017-07-24 DIAGNOSIS — L738 Other specified follicular disorders: Secondary | ICD-10-CM | POA: Diagnosis not present

## 2017-07-31 ENCOUNTER — Ambulatory Visit: Payer: Medicare Other | Admitting: Obstetrics & Gynecology

## 2017-08-25 ENCOUNTER — Ambulatory Visit (INDEPENDENT_AMBULATORY_CARE_PROVIDER_SITE_OTHER): Payer: Medicare Other | Admitting: Obstetrics & Gynecology

## 2017-08-25 VITALS — BP 100/70 | HR 72 | Resp 16 | Ht 61.75 in | Wt 104.0 lb

## 2017-08-25 DIAGNOSIS — D171 Benign lipomatous neoplasm of skin and subcutaneous tissue of trunk: Secondary | ICD-10-CM | POA: Diagnosis not present

## 2017-08-25 NOTE — Progress Notes (Signed)
GYNECOLOGY  VISIT  CC:   Recheck possible breast lipoma  HPI: 66 y.o. G52P0002 Married Caucasian female here for recheck of right breast lesion noted at her AEX.  Pt has been monitoring this and does not feel there is any change.  Denies pain.  Denies new breast findings.  Denies nipple discharge or breast skin changes.    Pt reports she's having some anxiety due to daughter getting married in [place], [place], in five weeks.  Having some insomnia as well.  Not desirous of treatment as she knows this is situational.  Patient Active Problem List   Diagnosis Date Noted  . Basal cell carcinoma of skin of right upper lip 08/24/2016    Past Medical History:  Diagnosis Date  . Arthritis   . BCC (basal cell carcinoma of skin)    on lip. Had Mohs surgery.  . Carpal tunnel syndrome   . Cataract    right eye 04/2014  . Fibroids   . HSV-2 infection   . PAT (paroxysmal atrial tachycardia) ([place]) 9/14  . Trigger finger   . Urge incontinence of urine     MEDS:   Current Outpatient Prescriptions on File Prior to Visit  Medication Sig Dispense Refill  . clobetasol cream (TEMOVATE) 8.75 % Apply 1 application topically as needed.     Marland Kitchen estradiol (ESTRACE VAGINAL) 0.1 MG/GM vaginal cream Place 1 gram vaginally up to twice weekly 42.5 g 0  . ketoconazole (NIZORAL) 2 % shampoo     . naproxen sodium (ANAPROX) 220 MG tablet Take 220 mg by mouth 2 (two) times daily with a meal.    . RESTASIS 0.05 % ophthalmic emulsion     . triamcinolone cream (KENALOG) 0.1 % as needed.    . valACYclovir (VALTREX) 500 MG tablet TAKE ONE TABLET DAILY 90 tablet 4  . Vitamin D, Ergocalciferol, (DRISDOL) 50000 units CAPS capsule Take 1 capsule (50,000 Units total) by mouth every 30 (thirty) days. Once monthly (Patient not taking: Reported on 08/25/2017) 3 capsule 4   No current facility-administered medications on file prior to visit.     ALLERGIES: Patient has no known allergies.  Family History  Problem Relation Age of  Onset  . Hypertension Mother   . Kidney Stones Father   . Diabetes Paternal Aunt   . Brain cancer Maternal Grandmother   . Asthma Daughter   . Colon cancer Neg Hx     SH:  Married, non smoker  Review of Systems  Constitutional: Negative.   Skin: Negative.     PHYSICAL EXAMINATION:    BP 100/70 (BP Location: Right Arm, Patient Position: Sitting, Cuff Size: Normal)   Pulse 72   Resp 16   Ht 5' 1.75" (1.568 m)   Wt 104 lb (47.2 kg)   LMP 02/23/2008 (Approximate)   BMI 19.18 kg/m     Physical Exam  Constitutional: She appears well-developed and well-nourished.  Neck: Normal range of motion. Neck supple. No thyromegaly present.  Respiratory: Right breast exhibits mass. Right breast exhibits no inverted nipple, no nipple discharge, no skin change and no tenderness. Left breast exhibits no inverted nipple, no mass, no nipple discharge, no skin change and no tenderness. Breasts are symmetrical.    Bilateral implants present.   Lymphadenopathy:    She has no axillary adenopathy.       Right: No supraclavicular adenopathy present.       Left: No supraclavicular adenopathy present.    Chaperone was present for exam.  Assessment:  Mass lateral to right breast c/w lipoma, no change  Plan: Pt will continue to monitor and if this enlarges, she will call for follow-up appt.  Pt will plan to proceed with MMG due 12/18

## 2017-08-26 ENCOUNTER — Encounter: Payer: Self-pay | Admitting: Obstetrics & Gynecology

## 2017-08-26 DIAGNOSIS — D171 Benign lipomatous neoplasm of skin and subcutaneous tissue of trunk: Secondary | ICD-10-CM | POA: Insufficient documentation

## 2017-10-19 ENCOUNTER — Other Ambulatory Visit: Payer: Self-pay | Admitting: Obstetrics & Gynecology

## 2017-10-19 NOTE — Telephone Encounter (Signed)
Medication refill request: Estrace vaginal cream  Last AEX:  11/13/16 SM  Next AEX: 12/29/17  Last MMG (if hormonal medication request): 11/10/16 BIRADS 1 negative Refill authorized: 02/04/17 #42.5g tube, 0RF. Today, please advise.

## 2017-10-20 DIAGNOSIS — H2513 Age-related nuclear cataract, bilateral: Secondary | ICD-10-CM | POA: Diagnosis not present

## 2017-11-09 DIAGNOSIS — Z1231 Encounter for screening mammogram for malignant neoplasm of breast: Secondary | ICD-10-CM | POA: Diagnosis not present

## 2017-11-09 DIAGNOSIS — Z803 Family history of malignant neoplasm of breast: Secondary | ICD-10-CM | POA: Diagnosis not present

## 2017-11-10 DIAGNOSIS — D2272 Melanocytic nevi of left lower limb, including hip: Secondary | ICD-10-CM | POA: Diagnosis not present

## 2017-11-10 DIAGNOSIS — D2372 Other benign neoplasm of skin of left lower limb, including hip: Secondary | ICD-10-CM | POA: Diagnosis not present

## 2017-11-10 DIAGNOSIS — L821 Other seborrheic keratosis: Secondary | ICD-10-CM | POA: Diagnosis not present

## 2017-11-10 DIAGNOSIS — L812 Freckles: Secondary | ICD-10-CM | POA: Diagnosis not present

## 2017-11-10 DIAGNOSIS — D225 Melanocytic nevi of trunk: Secondary | ICD-10-CM | POA: Diagnosis not present

## 2017-11-10 DIAGNOSIS — L570 Actinic keratosis: Secondary | ICD-10-CM | POA: Diagnosis not present

## 2017-11-10 DIAGNOSIS — Z85828 Personal history of other malignant neoplasm of skin: Secondary | ICD-10-CM | POA: Diagnosis not present

## 2017-11-10 DIAGNOSIS — L309 Dermatitis, unspecified: Secondary | ICD-10-CM | POA: Diagnosis not present

## 2017-11-13 ENCOUNTER — Encounter: Payer: Self-pay | Admitting: Obstetrics & Gynecology

## 2017-12-16 ENCOUNTER — Telehealth: Payer: Self-pay | Admitting: Obstetrics & Gynecology

## 2017-12-16 NOTE — Telephone Encounter (Signed)
Left patient a message to call back to reschedule a future appointment that was cancelled by the provider for an AEX with Dr. Sabra Heck on 12/29/17.

## 2017-12-18 DIAGNOSIS — Z85828 Personal history of other malignant neoplasm of skin: Secondary | ICD-10-CM | POA: Diagnosis not present

## 2017-12-18 DIAGNOSIS — L718 Other rosacea: Secondary | ICD-10-CM | POA: Diagnosis not present

## 2017-12-29 ENCOUNTER — Ambulatory Visit: Payer: Medicare Other | Admitting: Obstetrics & Gynecology

## 2018-02-15 ENCOUNTER — Ambulatory Visit: Payer: Medicare Other | Admitting: Obstetrics & Gynecology

## 2018-03-08 ENCOUNTER — Telehealth: Payer: Self-pay | Admitting: Obstetrics & Gynecology

## 2018-03-08 NOTE — Telephone Encounter (Signed)
Patient says she is experiencing pressure and feels bloated all the time.

## 2018-03-08 NOTE — Telephone Encounter (Signed)
Spoke with patient. Reports upper abdominal pressure, bloating and fullness when eating, started couple of weeks ago. Took OTC Zantac and this helped. Reports daily BM, takes stool softner daily, has "no urge" for BM. Last BM this morning, describes as small, no changes in color. "I don't feel bad, just not myself, less energy".   Denies vaginal bleeding, D/C, urinary complaints, fever/chills, N/V.   Recommended patient f/u with PCP for further evaluation, if OV needed with GYN after evaluation, return call to office to schedule. Patient states she is currently in [place], leaving to return to [facility] 4/16. Advised should symptoms worsen or new symptoms develop while traveling, seek care at local Urgent care/ER. Advised Dr. Sabra Heck will review, I will return call with any additional recommendations.   Last AEX 11/13/16, next AEX 08/02/18.  Routing to provider for final review. Patient is agreeable to disposition. Will close encounter.

## 2018-03-29 DIAGNOSIS — Z79899 Other long term (current) drug therapy: Secondary | ICD-10-CM | POA: Diagnosis not present

## 2018-03-29 DIAGNOSIS — R140 Abdominal distension (gaseous): Secondary | ICD-10-CM | POA: Diagnosis not present

## 2018-03-31 ENCOUNTER — Other Ambulatory Visit: Payer: Self-pay | Admitting: Obstetrics & Gynecology

## 2018-03-31 NOTE — Telephone Encounter (Signed)
Medication refill request: Valtrex 500mg  #90, 0R Last AEX:  11-13-16 Next AEX: 08-02-18 Last MMG (if hormonal medication request): 11-09-17 [id number] Refill authorized: Please advise  Medication refill request: Vit D 50,000 IU #3, 0R Last AEX:  11-13-16  Next AEX: 08-02-18 Last MMG (if hormonal medication request): 11-09-17 [id number] Refill authorized: Please advise  Medication refill request: Estrace cream 42.5gm Last AEX:  11-13-16 Next AEX: 08-02-18 Last MMG (if hormonal medication request): 11-09-17 [id number] Refill authorized: please advise

## 2018-04-29 ENCOUNTER — Encounter: Payer: Self-pay | Admitting: Obstetrics & Gynecology

## 2018-04-29 ENCOUNTER — Other Ambulatory Visit: Payer: Self-pay

## 2018-04-29 ENCOUNTER — Ambulatory Visit (INDEPENDENT_AMBULATORY_CARE_PROVIDER_SITE_OTHER): Payer: Medicare Other | Admitting: Obstetrics & Gynecology

## 2018-04-29 VITALS — BP 110/64 | HR 76 | Resp 16 | Ht 62.0 in | Wt 106.8 lb

## 2018-04-29 DIAGNOSIS — R140 Abdominal distension (gaseous): Secondary | ICD-10-CM

## 2018-04-29 DIAGNOSIS — Z01419 Encounter for gynecological examination (general) (routine) without abnormal findings: Secondary | ICD-10-CM

## 2018-04-29 DIAGNOSIS — Z124 Encounter for screening for malignant neoplasm of cervix: Secondary | ICD-10-CM | POA: Diagnosis not present

## 2018-04-29 MED ORDER — VALACYCLOVIR HCL 500 MG PO TABS: ORAL_TABLET | ORAL | 4 refills | Status: DC

## 2018-04-29 NOTE — Patient Instructions (Signed)
Check with Dr. Felipa Eth about a tetanus shot.  The last one you had was in 2009.   Also, check to make sure you've had a Vit D done.

## 2018-04-29 NOTE — Progress Notes (Signed)
Patient scheduled while in office for PUS on 05/25/18 at 2:30pm, consult to follow at 3pm with Dr. Sabra Heck. Patient declined earlier dates and times, will be out of town. Patient verbalizes understanding and is agreeable.

## 2018-04-29 NOTE — Progress Notes (Signed)
67 y.o. [id number] MarriedCaucasianF here for annual exam.  Reports she does to [place] every winter.  Has been home two months.  Had a lot of bloating for about a 7 day period.  Had a lot of constipation during this week as well.  This occurred while she was in [place].  Did not have any issues with bleeding.  Took some stool softeners and antiacids.  Did see Dr. Felipa Eth.  Had been on three antibiotics over the holiday.  She has been on a probiotics for about a month.    Patient's last menstrual period was 02/23/2008 (approximate).          Sexually active: No.  The current method of family planning is post menopausal status.    Exercising: Yes.    walking, weights  Smoker:  no  Health Maintenance: Pap:  11/13/16 Neg   09/12/14 Neg  History of abnormal Pap:  yes MMG:  11/09/17 BIRADS2:Benign  Colonoscopy:  03/16/15 Normal BMD:   11/10/16 Osteopenia  TDaP:  2009.  She is going to see Dr. Felipa Eth in July and will check with him when she sees him. Pneumonia vaccine(s):  PCP Shingrix:  D/w pt.  Information provided. Hep C testing: 11/13/16 Neg  Screening Labs: PCP   reports that she has never smoked. She has never used smokeless tobacco. She reports that she drinks about 8.4 oz of alcohol per week. She reports that she does not use drugs.  Past Medical History:  Diagnosis Date  . Arthritis   . BCC (basal cell carcinoma of skin)    on lip. Had Mohs surgery.  . Carpal tunnel syndrome   . Cataract    right eye 04/2014  . Fibroids   . HSV-2 infection   . PAT (paroxysmal atrial tachycardia) ([facility]) 9/14  . Trigger finger   . Urge incontinence of urine     Past Surgical History:  Procedure Laterality Date  . BREAST ENHANCEMENT SURGERY  1973  . CARPAL TUNNEL RELEASE  10/26/2012   Procedure: CARPAL TUNNEL RELEASE;  Surgeon: Cammie Sickle., MD;  Location: [facility];  Service: Orthopedics;  Laterality: Left;  . CATARACT EXTRACTION  2013  . CATARACT EXTRACTION  6/15  .  COLONOSCOPY    . EYE SURGERY Right 03/30/2017   right eye lid BX  . FACIAL COSMETIC SURGERY  2005  . GREAT TOE ARTHRODESIS, INTERPHALANGEAL JOINT  2007   rt  . HYSTEROSCOPY WITH RESECTOSCOPE  6/09      . JOINT REPLACEMENT Right 07/29/2016   right thumb.  Dr. Amedeo Plenty.  Marland Kitchen MOHS SURGERY  09/2016   Dr. Sarajane Jews  . TONSILLECTOMY  12/07   T&A    Current Outpatient Medications  Medication Sig Dispense Refill  . clobetasol cream (TEMOVATE) 7.25 % Apply 1 application topically as needed.     Marland Kitchen estradiol (ESTRACE VAGINAL) 0.1 MG/GM vaginal cream PLACE 1 GRAM VAGINALLY UP TO TWICE A WEEK 42.5 g 1  . ketoconazole (NIZORAL) 2 % shampoo     . naproxen sodium (ANAPROX) 220 MG tablet Take 220 mg by mouth 2 (two) times daily with a meal.    . RESTASIS 0.05 % ophthalmic emulsion     . triamcinolone cream (KENALOG) 0.1 % as needed.    . valACYclovir (VALTREX) 500 MG tablet TAKE ONE TABLET EACH DAY 90 tablet 1  . Vitamin D, Ergocalciferol, (DRISDOL) 50000 units CAPS capsule TAKE 1 CAPSULE BY MOUTH EVERY 30 DAYS ONCE MONTHLY 3 capsule 1  No current facility-administered medications for this visit.     Family History  Problem Relation Age of Onset  . Hypertension Mother   . Kidney Stones Father   . Diabetes Paternal Aunt   . Brain cancer Maternal Grandmother   . Asthma Daughter   . Colon cancer Neg Hx     Review of Systems  All other systems reviewed and are negative.   Exam:   BP 110/64 (BP Location: Right Arm, Patient Position: Sitting, Cuff Size: Normal)   Pulse 76   Resp 16   Ht 5\' 2"  (1.575 m)   Wt 106 lb 12.8 oz (48.4 kg)   LMP 02/23/2008 (Approximate)   BMI 19.53 kg/m     Height: 5\' 2"  (157.5 cm)  Ht Readings from Last 3 Encounters:  04/29/18 5\' 2"  (1.575 m)  08/25/17 5' 1.75" (1.568 m)  11/13/16 5' 1.75" (1.568 m)    General appearance: alert, cooperative and appears stated age Head: Normocephalic, without obvious abnormality, atraumatic Neck: no adenopathy, supple,  symmetrical, trachea midline and thyroid normal to inspection and palpation Lungs: clear to auscultation bilaterally Breasts: normal appearance, no masses or tenderness, cannot feel prior lipoma today, bilateral implants present Heart: regular rate and rhythm Abdomen: soft, non-tender; bowel sounds normal; no masses,  no organomegaly Extremities: extremities normal, atraumatic, no cyanosis or edema Skin: Skin color, texture, turgor normal. No rashes or lesions Lymph nodes: Cervical, supraclavicular, and axillary nodes normal. No abnormal inguinal nodes palpated Neurologic: Grossly normal   Pelvic: External genitalia:  no lesions              Urethra:  normal appearing urethra with no masses, tenderness or lesions              Bartholins and Skenes: normal                 Vagina: normal appearing vagina with normal color and discharge, no lesions              Cervix: no lesions              Pap taken: No. Bimanual Exam:  Uterus:  normal size, contour, position, consistency, mobility, non-tender              Adnexa: normal adnexa and no mass, fullness, tenderness               Rectovaginal: Confirms               Anus:  normal sphincter tone, no lesions  Chaperone was present for exam.  A:  Well Woman with normal exam PMP, no HRT H/o HSV 2 Vaginal atrophy Breast implants H/o BCC, sees dermatology every year bloating  P:   Mammogram guidelines reviewed pap smear 12/17.  Not obtained otday RF for Valtrex 100mg  daily.  #90/4RF. She will check with Dr. Felipa Eth about having Vit D tested Aware I think Tdap is due.  She wants to check with Dr. Felipa Eth about whether she had this done more recently Return for PUS Return annually or prn

## 2018-05-10 DIAGNOSIS — D2239 Melanocytic nevi of other parts of face: Secondary | ICD-10-CM | POA: Diagnosis not present

## 2018-05-10 DIAGNOSIS — D2230 Melanocytic nevi of unspecified part of face: Secondary | ICD-10-CM | POA: Diagnosis not present

## 2018-05-10 DIAGNOSIS — L570 Actinic keratosis: Secondary | ICD-10-CM | POA: Diagnosis not present

## 2018-05-10 DIAGNOSIS — Z85828 Personal history of other malignant neoplasm of skin: Secondary | ICD-10-CM | POA: Diagnosis not present

## 2018-05-10 DIAGNOSIS — L812 Freckles: Secondary | ICD-10-CM | POA: Diagnosis not present

## 2018-05-10 DIAGNOSIS — D485 Neoplasm of uncertain behavior of skin: Secondary | ICD-10-CM | POA: Diagnosis not present

## 2018-05-10 DIAGNOSIS — D225 Melanocytic nevi of trunk: Secondary | ICD-10-CM | POA: Diagnosis not present

## 2018-05-10 DIAGNOSIS — L821 Other seborrheic keratosis: Secondary | ICD-10-CM | POA: Diagnosis not present

## 2018-05-11 ENCOUNTER — Telehealth: Payer: Self-pay | Admitting: Obstetrics & Gynecology

## 2018-05-11 NOTE — Telephone Encounter (Signed)
Immunizations updated. Call returned to patient to notify, left detailed message, ok per dpr. Return call to office if any additional questions.   Routing to provider for final review. Patient is agreeable to disposition. Will close encounter.

## 2018-05-11 NOTE — Telephone Encounter (Signed)
Patient calling to let us know her last tdap was 04/06/13 from Dr. Lajean Manes.

## 2018-05-25 ENCOUNTER — Ambulatory Visit (INDEPENDENT_AMBULATORY_CARE_PROVIDER_SITE_OTHER): Payer: Medicare Other | Admitting: Obstetrics & Gynecology

## 2018-05-25 ENCOUNTER — Encounter: Payer: Self-pay | Admitting: Obstetrics & Gynecology

## 2018-05-25 ENCOUNTER — Ambulatory Visit (INDEPENDENT_AMBULATORY_CARE_PROVIDER_SITE_OTHER): Payer: Medicare Other

## 2018-05-25 ENCOUNTER — Other Ambulatory Visit: Payer: Self-pay

## 2018-05-25 VITALS — BP 120/66 | HR 80 | Resp 14 | Ht 62.0 in | Wt 108.0 lb

## 2018-05-25 DIAGNOSIS — E559 Vitamin D deficiency, unspecified: Secondary | ICD-10-CM

## 2018-05-25 DIAGNOSIS — D251 Intramural leiomyoma of uterus: Secondary | ICD-10-CM | POA: Diagnosis not present

## 2018-05-25 DIAGNOSIS — R140 Abdominal distension (gaseous): Secondary | ICD-10-CM

## 2018-05-25 NOTE — Patient Instructions (Signed)
[facility] [address] [place], [place] [address]

## 2018-05-25 NOTE — Progress Notes (Signed)
67 y.o. G34P0002 Married Caucasian female here for pelvic ultrasound due to abdominal bloating.  She has tried a pro-biotic after having an episode of constipation.  This has really helped.  Denies vaginal bleeding.    She checked lab work done by Dr. Felipa Eth and has not had a Vit D test done.  Would like this done today if possible.    Patient's last menstrual period was 02/23/2008 (approximate).  Contraception: PMP  Findings:  UTERUS: 7.2 x 6.4 x 3.3cm with 0.7cm, 0.5cm, 0.7cm, and 0.7cm intramural fibroids EMS:1.59mm ADNEXA: Left ovary: 1.4 x 1.2 x 0.7cm       Right ovary: 1.5 x 0.9 x 0.8cm CUL DE SAC: no free fluid  Discussion:  Findings reviewed.  No gyn cause of her symptoms noted with ultrasound.  As she is better, this does seem more GI related.  She is going to continue her current regimen..  Assessment:  Bloating Uterine fibroids VIt D deficiency  Plan:  Pt knows to call with any new or concerning symptoms Vit D level will be obtained today.  ~15 minutes spent with patient >50% of time was in face to face discussion of above.

## 2018-05-26 LAB — VITAMIN D 25 HYDROXY (VIT D DEFICIENCY, FRACTURES): Vit D, 25-Hydroxy: 27.4 ng/mL — ABNORMAL LOW (ref 30.0–100.0)

## 2018-06-18 ENCOUNTER — Other Ambulatory Visit: Payer: Self-pay | Admitting: Obstetrics & Gynecology

## 2018-06-18 MED ORDER — VITAMIN D (ERGOCALCIFEROL) 1.25 MG (50000 UNIT) PO CAPS: ORAL_CAPSULE | ORAL | 4 refills | Status: DC

## 2018-08-02 ENCOUNTER — Ambulatory Visit: Payer: Medicare Other | Admitting: Obstetrics & Gynecology

## 2018-08-02 ENCOUNTER — Encounter

## 2018-09-07 DIAGNOSIS — R1013 Epigastric pain: Secondary | ICD-10-CM | POA: Diagnosis not present

## 2018-09-07 DIAGNOSIS — Z79899 Other long term (current) drug therapy: Secondary | ICD-10-CM | POA: Diagnosis not present

## 2018-09-07 DIAGNOSIS — Z0000 Encounter for general adult medical examination without abnormal findings: Secondary | ICD-10-CM | POA: Diagnosis not present

## 2018-09-07 DIAGNOSIS — Z23 Encounter for immunization: Secondary | ICD-10-CM | POA: Diagnosis not present

## 2018-09-07 DIAGNOSIS — Z1389 Encounter for screening for other disorder: Secondary | ICD-10-CM | POA: Diagnosis not present

## 2018-11-09 DIAGNOSIS — L650 Telogen effluvium: Secondary | ICD-10-CM | POA: Diagnosis not present

## 2018-11-09 DIAGNOSIS — L853 Xerosis cutis: Secondary | ICD-10-CM | POA: Diagnosis not present

## 2018-11-09 DIAGNOSIS — D1801 Hemangioma of skin and subcutaneous tissue: Secondary | ICD-10-CM | POA: Diagnosis not present

## 2018-11-09 DIAGNOSIS — L649 Androgenic alopecia, unspecified: Secondary | ICD-10-CM | POA: Diagnosis not present

## 2018-11-09 DIAGNOSIS — Z85828 Personal history of other malignant neoplasm of skin: Secondary | ICD-10-CM | POA: Diagnosis not present

## 2018-11-09 DIAGNOSIS — L821 Other seborrheic keratosis: Secondary | ICD-10-CM | POA: Diagnosis not present

## 2018-11-09 DIAGNOSIS — L812 Freckles: Secondary | ICD-10-CM | POA: Diagnosis not present

## 2018-11-09 DIAGNOSIS — L820 Inflamed seborrheic keratosis: Secondary | ICD-10-CM | POA: Diagnosis not present

## 2018-11-10 DIAGNOSIS — H26491 Other secondary cataract, right eye: Secondary | ICD-10-CM | POA: Diagnosis not present

## 2018-11-10 DIAGNOSIS — Z803 Family history of malignant neoplasm of breast: Secondary | ICD-10-CM | POA: Diagnosis not present

## 2018-11-10 DIAGNOSIS — Z1231 Encounter for screening mammogram for malignant neoplasm of breast: Secondary | ICD-10-CM | POA: Diagnosis not present

## 2018-12-07 ENCOUNTER — Encounter: Payer: Self-pay | Admitting: Obstetrics & Gynecology

## 2019-03-17 DIAGNOSIS — L738 Other specified follicular disorders: Secondary | ICD-10-CM | POA: Diagnosis not present

## 2019-03-17 DIAGNOSIS — D225 Melanocytic nevi of trunk: Secondary | ICD-10-CM | POA: Diagnosis not present

## 2019-03-17 DIAGNOSIS — L821 Other seborrheic keratosis: Secondary | ICD-10-CM | POA: Diagnosis not present

## 2019-03-17 DIAGNOSIS — Z85828 Personal history of other malignant neoplasm of skin: Secondary | ICD-10-CM | POA: Diagnosis not present

## 2019-03-17 DIAGNOSIS — L218 Other seborrheic dermatitis: Secondary | ICD-10-CM | POA: Diagnosis not present

## 2019-04-12 DIAGNOSIS — Z85828 Personal history of other malignant neoplasm of skin: Secondary | ICD-10-CM | POA: Diagnosis not present

## 2019-04-12 DIAGNOSIS — D485 Neoplasm of uncertain behavior of skin: Secondary | ICD-10-CM | POA: Diagnosis not present

## 2019-04-12 DIAGNOSIS — L570 Actinic keratosis: Secondary | ICD-10-CM | POA: Diagnosis not present

## 2019-05-23 DIAGNOSIS — L812 Freckles: Secondary | ICD-10-CM | POA: Diagnosis not present

## 2019-05-23 DIAGNOSIS — D225 Melanocytic nevi of trunk: Secondary | ICD-10-CM | POA: Diagnosis not present

## 2019-05-23 DIAGNOSIS — L218 Other seborrheic dermatitis: Secondary | ICD-10-CM | POA: Diagnosis not present

## 2019-05-23 DIAGNOSIS — Z85828 Personal history of other malignant neoplasm of skin: Secondary | ICD-10-CM | POA: Diagnosis not present

## 2019-05-23 DIAGNOSIS — L718 Other rosacea: Secondary | ICD-10-CM | POA: Diagnosis not present

## 2019-05-23 DIAGNOSIS — L821 Other seborrheic keratosis: Secondary | ICD-10-CM | POA: Diagnosis not present

## 2019-05-23 DIAGNOSIS — L738 Other specified follicular disorders: Secondary | ICD-10-CM | POA: Diagnosis not present

## 2019-05-23 DIAGNOSIS — L308 Other specified dermatitis: Secondary | ICD-10-CM | POA: Diagnosis not present

## 2019-08-03 ENCOUNTER — Other Ambulatory Visit: Payer: Self-pay

## 2019-08-04 NOTE — Progress Notes (Deleted)
68 y.o. G2P0002 Married White or Caucasian female here for annual exam.    Patient's last menstrual period was 02/23/2008 (approximate).          Sexually active: {yes no:314532}  The current method of family planning is post menopausal status.    Exercising: {yes no:314532}  {types:19826} Smoker:  {YES NO:22349}  Health Maintenance: Pap:  11/13/2016 Neg  09/12/2014 Neg History of abnormal Pap:  yes MMG:  11/10/2018 BIRADS 2 Benign Density B Colonoscopy:  03/16/2015 Normal BMD:   11/10/2016 Osteopenia TDaP:  04/06/2013 Pneumonia vaccine(s):  PCP Shingrix:   PCP Hep C testing: 11/13/2016 Neg Screening Labs: PCP   reports that she has never smoked. She has never used smokeless tobacco. She reports current alcohol use of about 14.0 standard drinks of alcohol per week. She reports that she does not use drugs.  Past Medical History:  Diagnosis Date  . Arthritis   . BCC (basal cell carcinoma of skin)    on lip. Had Mohs surgery.  . Carpal tunnel syndrome   . Cataract    right eye 04/2014  . Fibroids   . HSV-2 infection   . PAT (paroxysmal atrial tachycardia) ([facility]) 9/14  . Trigger finger   . Urge incontinence of urine     Past Surgical History:  Procedure Laterality Date  . BREAST ENHANCEMENT SURGERY  1973  . CARPAL TUNNEL RELEASE  10/26/2012   Procedure: CARPAL TUNNEL RELEASE;  Surgeon: Cammie Sickle., MD;  Location: [facility];  Service: Orthopedics;  Laterality: Left;  . CATARACT EXTRACTION  2013  . CATARACT EXTRACTION  6/15  . COLONOSCOPY    . EYE SURGERY Right 03/30/2017   right eye lid BX  . FACIAL COSMETIC SURGERY  2005  . GREAT TOE ARTHRODESIS, INTERPHALANGEAL JOINT  2007   rt  . HYSTEROSCOPY WITH RESECTOSCOPE  6/09      . JOINT REPLACEMENT Right 07/29/2016   right thumb.  Dr. Amedeo Plenty.  Marland Kitchen MOHS SURGERY  09/2016   Dr. Sarajane Jews  . TONSILLECTOMY  12/07   T&A    Current Outpatient Medications  Medication Sig Dispense Refill  . clobetasol cream  (TEMOVATE) [id number] % Apply 1 application topically as needed.     Marland Kitchen estradiol (ESTRACE VAGINAL) 0.1 MG/GM vaginal cream PLACE 1 GRAM VAGINALLY UP TO TWICE A WEEK 42.5 g 1  . fluocinonide (LIDEX) 0.05 % external solution     . ketoconazole (NIZORAL) 2 % shampoo     . naproxen sodium (ANAPROX) 220 MG tablet Take 220 mg by mouth 2 (two) times daily with a meal.    . RESTASIS 0.05 % ophthalmic emulsion     . triamcinolone cream (KENALOG) 0.1 % as needed.    . valACYclovir (VALTREX) 500 MG tablet TAKE ONE TABLET EACH DAY 90 tablet 4  . Vitamin D, Ergocalciferol, (DRISDOL) 50000 units CAPS capsule TAKE 1 CAPSULE BY MOUTH EVERY 30 DAYS ONCE MONTHLY 3 capsule 4   No current facility-administered medications for this visit.     Family History  Problem Relation Age of Onset  . Hypertension Mother   . Kidney Stones Father   . Diabetes Paternal Aunt   . Brain cancer Maternal Grandmother   . Asthma Daughter   . Colon cancer Neg Hx     Review of Systems  Exam:   LMP 02/23/2008 (Approximate)   Height:      Ht Readings from Last 3 Encounters:  05/25/18 5\' 2"  (1.575 m)  04/29/18 5'  2" (1.575 m)  08/25/17 5' 1.75" (1.568 m)    General appearance: alert, cooperative and appears stated age Head: Normocephalic, without obvious abnormality, atraumatic Neck: no adenopathy, supple, symmetrical, trachea midline and thyroid {EXAM; THYROID:18604} Lungs: clear to auscultation bilaterally Breasts: {Exam; breast:13139::"normal appearance, no masses or tenderness"} Heart: regular rate and rhythm Abdomen: soft, non-tender; bowel sounds normal; no masses,  no organomegaly Extremities: extremities normal, atraumatic, no cyanosis or edema Skin: Skin color, texture, turgor normal. No rashes or lesions Lymph nodes: Cervical, supraclavicular, and axillary nodes normal. No abnormal inguinal nodes palpated Neurologic: Grossly normal   Pelvic: External genitalia:  no lesions              Urethra:  normal  appearing urethra with no masses, tenderness or lesions              Bartholins and Skenes: normal                 Vagina: normal appearing vagina with normal color and discharge, no lesions              Cervix: {exam; cervix:14595}              Pap taken: {yes no:314532} Bimanual Exam:  Uterus:  {exam; uterus:12215}              Adnexa: {exam; adnexa:12223}               Rectovaginal: Confirms               Anus:  normal sphincter tone, no lesions  Chaperone was present for exam.  A:  Well Woman with normal exam  P:   {plan; gyn:5269::"mammogram","pap smear","return annually or prn"}

## 2019-08-05 ENCOUNTER — Telehealth: Payer: Self-pay | Admitting: Obstetrics & Gynecology

## 2019-08-05 ENCOUNTER — Encounter

## 2019-08-05 ENCOUNTER — Ambulatory Visit: Payer: Medicare Other | Admitting: Obstetrics & Gynecology

## 2019-08-05 NOTE — Telephone Encounter (Signed)
Patient arrived to her appointment today 11 minutes late. She did rescheduled to 08/09/19 at 11:30 am.

## 2019-08-09 ENCOUNTER — Other Ambulatory Visit: Payer: Self-pay

## 2019-08-09 ENCOUNTER — Ambulatory Visit (INDEPENDENT_AMBULATORY_CARE_PROVIDER_SITE_OTHER): Payer: Medicare Other | Admitting: Obstetrics & Gynecology

## 2019-08-09 ENCOUNTER — Encounter: Payer: Self-pay | Admitting: Obstetrics & Gynecology

## 2019-08-09 ENCOUNTER — Other Ambulatory Visit (HOSPITAL_COMMUNITY)
Admission: RE | Admit: 2019-08-09 | Discharge: 2019-08-09 | Disposition: A | Discharge status: Home / Self Care | Payer: Medicare Other | Source: Ambulatory Visit | Attending: Obstetrics & Gynecology | Admitting: Obstetrics & Gynecology

## 2019-08-09 VITALS — BP 110/80 | HR 68 | Temp 97.9°F | Ht 62.0 in | Wt 105.6 lb

## 2019-08-09 DIAGNOSIS — Z0000 Encounter for general adult medical examination without abnormal findings: Secondary | ICD-10-CM

## 2019-08-09 DIAGNOSIS — Z124 Encounter for screening for malignant neoplasm of cervix: Secondary | ICD-10-CM

## 2019-08-09 DIAGNOSIS — Z01419 Encounter for gynecological examination (general) (routine) without abnormal findings: Secondary | ICD-10-CM | POA: Diagnosis not present

## 2019-08-09 LAB — POCT URINALYSIS DIPSTICK
Bilirubin, UA: NEGATIVE
Blood, UA: NEGATIVE
Glucose, UA: NEGATIVE
Ketones, UA: NEGATIVE
Leukocytes, UA: NEGATIVE
Nitrite, UA: NEGATIVE
Protein, UA: NEGATIVE
Urobilinogen, UA: 0.2 E.U./dL
pH, UA: 6 (ref 5.0–8.0)

## 2019-08-09 MED ORDER — VITAMIN D (ERGOCALCIFEROL) 1.25 MG (50000 UNIT) PO CAPS: ORAL_CAPSULE | ORAL | 4 refills | Status: DC

## 2019-08-09 MED ORDER — VALACYCLOVIR HCL 500 MG PO TABS: ORAL_TABLET | ORAL | 4 refills | Status: DC

## 2019-08-09 NOTE — Progress Notes (Signed)
68 y.o. G60P0002 Married White or Caucasian female here for annual exam.  Doing well.  Denies vaginal bleeding.  Hasn't gotten a flu shot this year.  Is contemplating not getting a flu shot.    She has not had any additional bloating issues since prior to her visit last year.  Being much more careful about not getting constipation.    Denies vaginal bleeding.    Mother passed in January.  This has been very sad for Lindsey Robinson.    Did see Dr. Felipa Eth in December.    Patient's last menstrual period was 02/23/2008 (approximate).          Sexually active: No.  The current method of family planning is post menopausal status.    Exercising: Yes.    walking, golf, pilates Smoker:  no  Health Maintenance: Pap:  11/13/16 Neg   09/12/14 Neg  History of abnormal Pap:  yes MMG:  11/10/18 BIRADS2:benign  Colonoscopy:  03/16/15 f/u 10 years  BMD:   11/10/16 Osteopenia TDaP:  2014 Pneumonia vaccine(s):  done Shingrix:   Not done yet Hep C testing: 11/13/16 neg  Screening Labs: PCP   reports that she has never smoked. She has never used smokeless tobacco. She reports current alcohol use of about 14.0 standard drinks of alcohol per week. She reports that she does not use drugs.  Past Medical History:  Diagnosis Date  . Arthritis   . BCC (basal cell carcinoma of skin)    on lip. Had Mohs surgery.  . Carpal tunnel syndrome   . Cataract    right eye 04/2014  . Fibroids   . HSV-2 infection   . PAT (paroxysmal atrial tachycardia) ([facility]) 9/14  . Trigger finger   . Urge incontinence of urine     Past Surgical History:  Procedure Laterality Date  . BREAST ENHANCEMENT SURGERY  1973  . CARPAL TUNNEL RELEASE  10/26/2012   Procedure: CARPAL TUNNEL RELEASE;  Surgeon: Cammie Sickle., MD;  Location: [facility];  Service: Orthopedics;  Laterality: Left;  . CATARACT EXTRACTION  2013  . CATARACT EXTRACTION  6/15  . COLONOSCOPY    . EYE SURGERY Right 03/30/2017   right eye lid BX   . FACIAL COSMETIC SURGERY  2005  . GREAT TOE ARTHRODESIS, INTERPHALANGEAL JOINT  2007   rt  . HYSTEROSCOPY WITH RESECTOSCOPE  6/09      . JOINT REPLACEMENT Right 07/29/2016   right thumb.  Dr. Amedeo Plenty.  Marland Kitchen MOHS SURGERY  09/2016   Dr. Sarajane Jews  . TONSILLECTOMY  12/07   T&A    Current Outpatient Medications  Medication Sig Dispense Refill  . clobetasol cream (TEMOVATE) [id number] % Apply 1 application topically as needed.     . fluocinonide (LIDEX) 0.05 % external solution     . ketoconazole (NIZORAL) 2 % shampoo     . metroNIDAZOLE (METROGEL) 1 % gel Apply 1 application topically 2 (two) times daily. Face    . naproxen sodium (ANAPROX) 220 MG tablet Take 220 mg by mouth 2 (two) times daily with a meal.    . RESTASIS 0.05 % ophthalmic emulsion     . triamcinolone cream (KENALOG) 0.1 % as needed.    . valACYclovir (VALTREX) 500 MG tablet TAKE ONE TABLET EACH DAY 90 tablet 4  . Vitamin D, Ergocalciferol, (DRISDOL) 50000 units CAPS capsule TAKE 1 CAPSULE BY MOUTH EVERY 30 DAYS ONCE MONTHLY 3 capsule 4  . estradiol (ESTRACE VAGINAL) 0.1 MG/GM vaginal cream  PLACE 1 GRAM VAGINALLY UP TO TWICE A WEEK (Patient not taking: Reported on 08/09/2019) 42.5 g 1   No current facility-administered medications for this visit.     Family History  Problem Relation Age of Onset  . Hypertension Mother   . Kidney Stones Father   . Diabetes Paternal Aunt   . Brain cancer Maternal Grandmother   . Asthma Daughter   . Colon cancer Neg Hx     Review of Systems  All other systems reviewed and are negative.   Exam:   BP 110/80   Pulse 68   Temp 97.9 F (36.6 C) (Temporal)   Ht 5\' 2"  (1.575 m)   Wt 105 lb 9.6 oz (47.9 kg)   LMP 02/23/2008 (Approximate)   BMI 19.31 kg/m   Height:   Height: 5\' 2"  (157.5 cm)  Ht Readings from Last 3 Encounters:  08/09/19 5\' 2"  (1.575 m)  05/25/18 5\' 2"  (1.575 m)  04/29/18 5\' 2"  (1.575 m)    General appearance: alert, cooperative and appears stated age Head:  Normocephalic, without obvious abnormality, atraumatic Neck: no adenopathy, supple, symmetrical, trachea midline and thyroid normal to inspection and palpation Lungs: clear to auscultation bilaterally Breasts: normal appearance, no masses or tenderness Heart: regular rate and rhythm Abdomen: soft, non-tender; bowel sounds normal; no masses,  no organomegaly Extremities: extremities normal, atraumatic, no cyanosis or edema Skin: Skin color, texture, turgor normal. No rashes or lesions Lymph nodes: Cervical, supraclavicular, and axillary nodes normal. No abnormal inguinal nodes palpated Neurologic: Grossly normal   Pelvic: External genitalia:  no lesions              Urethra:  normal appearing urethra with no masses, tenderness or lesions              Bartholins and Skenes: normal                 Vagina: normal appearing vagina with normal color and discharge, no lesions              Cervix: no lesions              Pap taken: Yes.   Bimanual Exam:  Uterus:  normal size, contour, position, consistency, mobility, non-tender              Adnexa: normal adnexa and no mass, fullness, tenderness               Rectovaginal: Confirms               Anus:  normal sphincter tone, no lesions  Chaperone was present for exam.  A:  Well Woman with normal exam PMP, no HRT H/o HSV 2 Vaginal atrophic changes (has stopped using vaginal estrogen) Breast implants H/O BCC, followed by Dr. Coralie Common  P:   Mammogram guidelines reviewed pap smear obtained today RF for Valtrex 500mg  daily.  #90/4RF RF for Vit D 50K, 1 capsule weekly.  #12/4RF Labwork done with Dr. Felipa Eth in December.  Has upcoming appt as well. Plan BMD next year return annually or prn

## 2019-08-09 NOTE — Patient Instructions (Addendum)
[facility] at [facility],    [id number] [address]: [phone]  Sunday:Closed Monday:7:30 AM - 6:00 PM Tuesday:7:30 AM - 6:00 PM Wednesday:7:30 AM - 6:00 PM Thursday:7:30 AM - 6:00 PM Friday:7:30 AM - 6:00 PM Saturday:Closed

## 2019-08-12 LAB — CYTOLOGY - PAP: Diagnosis: NEGATIVE

## 2019-09-19 DIAGNOSIS — I471 Supraventricular tachycardia: Secondary | ICD-10-CM | POA: Diagnosis not present

## 2019-09-19 DIAGNOSIS — Z79899 Other long term (current) drug therapy: Secondary | ICD-10-CM | POA: Diagnosis not present

## 2019-09-19 DIAGNOSIS — Z0000 Encounter for general adult medical examination without abnormal findings: Secondary | ICD-10-CM | POA: Diagnosis not present

## 2019-09-19 DIAGNOSIS — Z1389 Encounter for screening for other disorder: Secondary | ICD-10-CM | POA: Diagnosis not present

## 2019-09-19 DIAGNOSIS — M85862 Other specified disorders of bone density and structure, left lower leg: Secondary | ICD-10-CM | POA: Diagnosis not present

## 2019-09-19 DIAGNOSIS — Z23 Encounter for immunization: Secondary | ICD-10-CM | POA: Diagnosis not present

## 2019-09-19 DIAGNOSIS — M85861 Other specified disorders of bone density and structure, right lower leg: Secondary | ICD-10-CM | POA: Diagnosis not present

## 2019-10-13 DIAGNOSIS — Z03818 Encounter for observation for suspected exposure to other biological agents ruled out: Secondary | ICD-10-CM | POA: Diagnosis not present

## 2019-10-13 DIAGNOSIS — Z20828 Contact with and (suspected) exposure to other viral communicable diseases: Secondary | ICD-10-CM | POA: Diagnosis not present

## 2019-11-14 ENCOUNTER — Other Ambulatory Visit: Payer: Medicare Other

## 2019-11-14 ENCOUNTER — Ambulatory Visit: Payer: Medicare Other | Attending: Internal Medicine

## 2019-11-14 DIAGNOSIS — R238 Other skin changes: Secondary | ICD-10-CM

## 2019-11-14 DIAGNOSIS — U071 COVID-19: Secondary | ICD-10-CM

## 2019-11-15 LAB — NOVEL CORONAVIRUS, NAA: SARS-CoV-2, NAA: NOT DETECTED

## 2019-12-23 ENCOUNTER — Ambulatory Visit: Payer: Medicare Other

## 2019-12-30 ENCOUNTER — Ambulatory Visit: Payer: Medicare Other | Attending: Internal Medicine

## 2019-12-30 DIAGNOSIS — Z23 Encounter for immunization: Secondary | ICD-10-CM

## 2019-12-30 NOTE — Progress Notes (Signed)
   Covid-19 Vaccination Clinic  Name:  ELIA KRATKY    MRN: [id number] DOB: 21-Aug-1951  12/30/2019  Ms. Wardrop was observed post Covid-19 immunization for 15 minutes without incidence. She was provided with Vaccine Information Sheet and instruction to access the V-Safe system.   Ms. Menacho was instructed to call 911 with any severe reactions post vaccine: Marland Kitchen Difficulty breathing  . Swelling of your face and throat  . A fast heartbeat  . A bad rash all over your body  . Dizziness and weakness    Immunizations Administered    Name Date Dose VIS Date Route   Pfizer COVID-19 Vaccine 12/30/2019  9:07 AM 0.3 mL 11/04/2019 Intramuscular   Manufacturer: [place]   Lot: [id number]   [place]: [id number]

## 2020-01-03 ENCOUNTER — Ambulatory Visit: Payer: Medicare Other

## 2020-01-17 ENCOUNTER — Encounter: Payer: Self-pay | Admitting: Obstetrics & Gynecology

## 2020-01-17 DIAGNOSIS — Z1231 Encounter for screening mammogram for malignant neoplasm of breast: Secondary | ICD-10-CM | POA: Diagnosis not present

## 2020-01-24 ENCOUNTER — Ambulatory Visit: Payer: Medicare Other | Attending: Internal Medicine

## 2020-01-24 DIAGNOSIS — Z23 Encounter for immunization: Secondary | ICD-10-CM | POA: Insufficient documentation

## 2020-01-24 NOTE — Progress Notes (Signed)
   Covid-19 Vaccination Clinic  Name:  Lindsey Robinson    MRN: [id number] DOB: [birth year]  01/24/2020  [name] was observed post Covid-19 immunization for 15 minutes without incident. She was provided with Vaccine Information Sheet and instruction to access the V-Safe system.   Ms. Warning was instructed to call 911 with any severe reactions post vaccine: Marland Kitchen Difficulty breathing  . Swelling of face and throat  . A fast heartbeat  . A bad rash all over body  . Dizziness and weakness   Immunizations Administered    Name Date Dose VIS Date Route   Pfizer COVID-19 Vaccine 01/24/2020  9:52 AM 0.3 mL 11/04/2019 Intramuscular   Manufacturer: [place]   Lot: [id number]   [place]: [id number]

## 2020-02-15 DIAGNOSIS — L718 Other rosacea: Secondary | ICD-10-CM | POA: Diagnosis not present

## 2020-02-15 DIAGNOSIS — L648 Other androgenic alopecia: Secondary | ICD-10-CM | POA: Diagnosis not present

## 2020-10-22 NOTE — Progress Notes (Signed)
69 y.o. G81P0002 Married White or Caucasian female here for annual exam.     No specific concerns today except concern that she has lost 1/4 inch in height. Patient's last menstrual period was 02/23/2008 (approximate).          Sexually active: No.  The current method of family planning is post menopausal status.    Exercising: Yes.    pilates, walk & weights Smoker:  no  Health Maintenance: Pap:  11-13-16 neg, 08-09-2019 neg History of abnormal Pap:  yes MMG:  01-17-2020 category b density birads 2:neg Colonoscopy:  03-16-15 f/u 35yrs BMD:   11-10-16 osteopenia TDaP:  2014 Gardasil:   n/a Covid-19: pfizer done Pneumonia vaccine(s):  done Shingrix:   Had done Hep C testing: neg 2017 Screening Labs: With PCP   reports that she has quit smoking. She has never used smokeless tobacco. She reports current alcohol use of about 14.0 standard drinks of alcohol per week. She reports that she does not use drugs.  Past Medical History:  Diagnosis Date  . Abnormal Pap smear of cervix   . Arthritis   . BCC (basal cell carcinoma of skin)    on lip. Had Mohs surgery.  . Carpal tunnel syndrome   . Cataract    right eye 04/2014  . Fibroids   . HSV-2 infection   . PAT (paroxysmal atrial tachycardia) ([place]) 9/14  . Trigger finger   . Urge incontinence of urine     Past Surgical History:  Procedure Laterality Date  . BREAST ENHANCEMENT SURGERY  1973  . CARPAL TUNNEL RELEASE  10/26/2012   Procedure: CARPAL TUNNEL RELEASE;  Surgeon: Cammie Sickle., MD;  Location: [facility];  Service: Orthopedics;  Laterality: Left;  . CATARACT EXTRACTION  2013  . CATARACT EXTRACTION  6/15  . EYE SURGERY Right 03/30/2017   right eye lid BX  . FACIAL COSMETIC SURGERY  2005  . GREAT TOE ARTHRODESIS, INTERPHALANGEAL JOINT  2007   rt  . HYSTEROSCOPY WITH RESECTOSCOPE  6/09      . JOINT REPLACEMENT Right 07/29/2016   right thumb.  Dr. Amedeo Plenty.  Marland Kitchen MOHS SURGERY  09/2016   Dr. Sarajane Jews  .  TONSILLECTOMY  12/07   T&A    Current Outpatient Medications  Medication Sig Dispense Refill  . clobetasol cream (TEMOVATE) 1.47 % Apply 1 application topically as needed.     . fluocinonide (LIDEX) 0.05 % external solution     . triamcinolone cream (KENALOG) 0.1 % as needed.    Marland Kitchen UNABLE TO FIND Doxycycline 20mg  qod    . valACYclovir (VALTREX) 500 MG tablet TAKE ONE TABLET EACH DAY 90 tablet 4  . Vitamin D, Ergocalciferol, (DRISDOL) 1.25 MG (50000 UT) CAPS capsule TAKE 1 CAPSULE BY MOUTH EVERY 30 DAYS ONCE MONTHLY (Patient taking differently: Take q 2 weeks) 3 capsule 4  . XIIDRA 5 % SOLN INSTILL ONE DROP IN EACH EYE EVERY 12 HOURS    . zaleplon (SONATA) 5 MG capsule Take 5 mg by mouth at bedtime as needed.     No current facility-administered medications for this visit.    Family History  Problem Relation Age of Onset  . Hypertension Mother   . Kidney Stones Father   . Diabetes Paternal Aunt   . Brain cancer Maternal Grandmother   . Asthma Daughter   . Colon cancer Neg Hx     Review of Systems  Constitutional: Negative.   HENT: Negative.   Eyes: Negative.  Respiratory: Negative.   Cardiovascular: Negative.   Gastrointestinal: Negative.   Endocrine: Negative.   Genitourinary: Negative.   Musculoskeletal: Negative.   Skin: Negative.   Allergic/Immunologic: Negative.   Neurological: Negative.   Hematological: Negative.   Psychiatric/Behavioral: Negative.     Exam:   BP 110/70   Pulse 68   Resp 16   Ht 5' 1.75" (1.568 m)   Wt 104 lb (47.2 kg)   LMP 02/23/2008 (Approximate)   BMI 19.18 kg/m   Height: 5' 1.75" (156.8 cm)  General appearance: alert, cooperative and appears stated age Head: Normocephalic, without obvious abnormality, atraumatic Neck: no adenopathy, supple, symmetrical, trachea midline and thyroid normal to inspection and palpation Lungs: clear to auscultation bilaterally Breasts: normal appearance, no masses or tenderness, implants Heart: regular  rate and rhythm Abdomen: soft, non-tender; bowel sounds normal; no masses,  no organomegaly Extremities: extremities normal, atraumatic, no cyanosis or edema Skin: Skin color, texture, turgor normal. No rashes or lesions Lymph nodes: Cervical, supraclavicular, and axillary nodes normal. No abnormal inguinal nodes palpated Neurologic: Grossly normal   Pelvic: External genitalia:  no lesions              Urethra:  normal appearing urethra with no masses, tenderness or lesions              Bartholins and Skenes: normal                 Vagina: atrophy, yellowish discharge              Cervix: no cervical motion tenderness              Pap taken: No. Bimanual Exam:  Uterus:  normal size, contour, position, consistency, mobility, non-tender              Adnexa: no mass, fullness, tenderness               Rectovaginal: Confirms               Anus:  normal sphincter tone, no lesions  Royal Hawthorn, CMA Chaperone was present for exam.  A:  Well Woman with normal exam  Osteopenia  P:   Mammogram, pt to schedule end of February. She will also schedule DEXA   pap smear due 2023   return annually or prn

## 2020-10-23 ENCOUNTER — Encounter: Payer: Self-pay | Admitting: Nurse Practitioner

## 2020-10-23 ENCOUNTER — Other Ambulatory Visit: Payer: Self-pay

## 2020-10-23 ENCOUNTER — Ambulatory Visit: Payer: Medicare Other | Admitting: Nurse Practitioner

## 2020-10-23 ENCOUNTER — Ambulatory Visit (INDEPENDENT_AMBULATORY_CARE_PROVIDER_SITE_OTHER): Payer: Medicare Other | Admitting: Nurse Practitioner

## 2020-10-23 VITALS — BP 110/70 | HR 68 | Resp 16 | Ht 61.75 in | Wt 104.0 lb

## 2020-10-23 DIAGNOSIS — M8580 Other specified disorders of bone density and structure, unspecified site: Secondary | ICD-10-CM | POA: Diagnosis not present

## 2020-10-23 DIAGNOSIS — Z01419 Encounter for gynecological examination (general) (routine) without abnormal findings: Secondary | ICD-10-CM | POA: Diagnosis not present

## 2020-10-23 DIAGNOSIS — Z780 Asymptomatic menopausal state: Secondary | ICD-10-CM

## 2020-10-23 NOTE — Patient Instructions (Signed)
Try Viactiv calcium chew for a yummy way to get calcium in your diet (1200-1500mg  calcium)  An order will be placed at [facility] for your Bone scan. Please schedule with your mammogram (due end of February)   Health Maintenance After Age 70 After age 67, you are at a higher risk for certain long-term diseases and infections as well as injuries from falls. Falls are a major cause of broken bones and head injuries in people who are older than age 70. Getting regular preventive care can help to keep you healthy and well. Preventive care includes getting regular testing and making lifestyle changes as recommended by your health care provider. Talk with your health care provider about:  Which screenings and tests you should have. A screening is a test that checks for a disease when you have no symptoms.  A diet and exercise plan that is right for you. What should I know about screenings and tests to prevent falls? Screening and testing are the best ways to find a health problem early. Early diagnosis and treatment give you the best chance of managing medical conditions that are common after age 5. Certain conditions and lifestyle choices may make you more likely to have a fall. Your health care provider may recommend:  Regular vision checks. Poor vision and conditions such as cataracts can make you more likely to have a fall. If you wear glasses, make sure to get your prescription updated if your vision changes.  Medicine review. Work with your health care provider to regularly review all of the medicines you are taking, including over-the-counter medicines. Ask your health care provider about any side effects that may make you more likely to have a fall. Tell your health care provider if any medicines that you take make you feel dizzy or sleepy.  Osteoporosis screening. Osteoporosis is a condition that causes the bones to get weaker. This can make the bones weak and cause them to break more  easily.  Blood pressure screening. Blood pressure changes and medicines to control blood pressure can make you feel dizzy.  Strength and balance checks. Your health care provider may recommend certain tests to check your strength and balance while standing, walking, or changing positions.  Foot health exam. Foot pain and numbness, as well as not wearing proper footwear, can make you more likely to have a fall.  Depression screening. You may be more likely to have a fall if you have a fear of falling, feel emotionally low, or feel unable to do activities that you used to do.  Alcohol use screening. Using too much alcohol can affect your balance and may make you more likely to have a fall. What actions can I take to lower my risk of falls? General instructions  Talk with your health care provider about your risks for falling. Tell your health care provider if: ? You fall. Be sure to tell your health care provider about all falls, even ones that seem minor. ? You feel dizzy, sleepy, or off-balance.  Take over-the-counter and prescription medicines only as told by your health care provider. These include any supplements.  Eat a healthy diet and maintain a healthy weight. A healthy diet includes low-fat dairy products, low-fat (lean) meats, and fiber from whole grains, beans, and lots of fruits and vegetables. Home safety  Remove any tripping hazards, such as rugs, cords, and clutter.  Install safety equipment such as grab bars in bathrooms and safety rails on stairs.  Keep rooms and walkways  well-lit. Activity   Follow a regular exercise program to stay fit. This will help you maintain your balance. Ask your health care provider what types of exercise are appropriate for you.  If you need a cane or walker, use it as recommended by your health care provider.  Wear supportive shoes that have nonskid soles. Lifestyle  Do not drink alcohol if your health care provider tells you not to  drink.  If you drink alcohol, limit how much you have: ? 0-1 drink a day for women. ? 0-2 drinks a day for men.  Be aware of how much alcohol is in your drink. In the [place] one drink equals one typical bottle of beer (12 oz), one-half glass of wine (5 oz), or one shot of hard liquor (1 oz).  Do not use any products that contain nicotine or tobacco, such as cigarettes and e-cigarettes. If you need help quitting, ask your health care provider. Summary  Having a healthy lifestyle and getting preventive care can help to protect your health and wellness after age 29.  Screening and testing are the best way to find a health problem early and help you avoid having a fall. Early diagnosis and treatment give you the best chance for managing medical conditions that are more common for people who are older than age 74.  Falls are a major cause of broken bones and head injuries in people who are older than age 52. Take precautions to prevent a fall at home.  Work with your health care provider to learn what changes you can make to improve your health and wellness and to prevent falls. This information is not intended to replace advice given to you by your health care provider. Make sure you discuss any questions you have with your health care provider. Document Revised: 03/03/2019 Document Reviewed: 09/23/2017 Elsevier Patient Education  2020 [employer].

## 2020-11-20 ENCOUNTER — Other Ambulatory Visit: Payer: Self-pay | Admitting: Obstetrics & Gynecology

## 2020-11-20 NOTE — Telephone Encounter (Signed)
Medication refill request: Valtrex Last AEX:  10/23/20 Dr. Hyacinth Meeker Next AEX: 10/28/21 Dr. Oscar La Last MMG (if hormonal medication request): n/a Refill authorized: today, please advise

## 2020-11-21 NOTE — Telephone Encounter (Signed)
Please call the patient. I got a refill request for daily valtrex. On review of the chart it says she isn't sexually active. I'm wondering if she still needs to be on suppression. Please ask if she is having frequent outbreaks? If not she should consider going to using the valtrex just with an outbreak. Outbreaks tend to come less frequently with time.  Her primary isn't in the same EMR, so I can't see her lab work. Please confirm no h/o renal disease

## 2020-11-21 NOTE — Telephone Encounter (Signed)
Spoke with patient. Patient states now is not a good time to talk, states she will return call to office on 11/27/20 to further discuss.

## 2021-01-10 DIAGNOSIS — R9431 Abnormal electrocardiogram [ECG] [EKG]: Secondary | ICD-10-CM | POA: Diagnosis not present

## 2021-01-10 DIAGNOSIS — R002 Palpitations: Secondary | ICD-10-CM | POA: Diagnosis not present

## 2021-01-21 ENCOUNTER — Telehealth: Payer: Self-pay

## 2021-01-21 DIAGNOSIS — K5901 Slow transit constipation: Secondary | ICD-10-CM | POA: Diagnosis not present

## 2021-01-21 DIAGNOSIS — L650 Telogen effluvium: Secondary | ICD-10-CM | POA: Diagnosis not present

## 2021-01-21 DIAGNOSIS — I471 Supraventricular tachycardia: Secondary | ICD-10-CM | POA: Diagnosis not present

## 2021-01-21 DIAGNOSIS — R002 Palpitations: Secondary | ICD-10-CM | POA: Diagnosis not present

## 2021-01-21 DIAGNOSIS — Z85828 Personal history of other malignant neoplasm of skin: Secondary | ICD-10-CM | POA: Diagnosis not present

## 2021-01-21 DIAGNOSIS — L659 Nonscarring hair loss, unspecified: Secondary | ICD-10-CM | POA: Diagnosis not present

## 2021-01-21 NOTE — Telephone Encounter (Signed)
Received MyChart message from the patient's husband requesting for Ms. Varden to establish with Dr. Burt Knack.  Due to Dr. Antionette Char extremely limited clinic accessibility/availability, he is no longer accepting new patients. He recommends the patient establish with Dr. Johney Frame as he trusts her and specializes in women's cardiac care.   Left message to call back.

## 2021-01-23 NOTE — Telephone Encounter (Signed)
Scheduled Ms. Meyerhoff for new patient evaluation with Dr. Johney Frame on Apr 02, 2021.  She was grateful for call and agrees with plan.  Will have Medical Records request records from Dr. Felipa Eth.

## 2021-02-08 ENCOUNTER — Other Ambulatory Visit: Payer: Self-pay | Admitting: *Deleted

## 2021-02-08 MED ORDER — VALACYCLOVIR HCL 500 MG PO TABS
ORAL_TABLET | ORAL | 4 refills | Status: DC
Start: 2021-02-08 — End: 2021-10-24

## 2021-02-21 DIAGNOSIS — R55 Syncope and collapse: Secondary | ICD-10-CM | POA: Diagnosis not present

## 2021-02-21 DIAGNOSIS — R0602 Shortness of breath: Secondary | ICD-10-CM | POA: Diagnosis not present

## 2021-03-03 DIAGNOSIS — R002 Palpitations: Secondary | ICD-10-CM | POA: Diagnosis not present

## 2021-03-03 DIAGNOSIS — I491 Atrial premature depolarization: Secondary | ICD-10-CM | POA: Diagnosis not present

## 2021-03-04 DIAGNOSIS — I10 Essential (primary) hypertension: Secondary | ICD-10-CM | POA: Diagnosis not present

## 2021-03-04 DIAGNOSIS — I341 Nonrheumatic mitral (valve) prolapse: Secondary | ICD-10-CM | POA: Diagnosis not present

## 2021-03-04 DIAGNOSIS — R002 Palpitations: Secondary | ICD-10-CM | POA: Diagnosis not present

## 2021-03-04 DIAGNOSIS — I340 Nonrheumatic mitral (valve) insufficiency: Secondary | ICD-10-CM | POA: Diagnosis not present

## 2021-03-19 ENCOUNTER — Other Ambulatory Visit (HOSPITAL_BASED_OUTPATIENT_CLINIC_OR_DEPARTMENT_OTHER)
Admission: RE | Admit: 2021-03-19 | Discharge: 2021-03-19 | Disposition: A | Discharge status: Home / Self Care | Payer: Medicare Other | Source: Ambulatory Visit | Attending: Obstetrics & Gynecology | Admitting: Obstetrics & Gynecology

## 2021-03-19 ENCOUNTER — Encounter (HOSPITAL_BASED_OUTPATIENT_CLINIC_OR_DEPARTMENT_OTHER): Payer: Self-pay | Admitting: Obstetrics & Gynecology

## 2021-03-19 ENCOUNTER — Ambulatory Visit (INDEPENDENT_AMBULATORY_CARE_PROVIDER_SITE_OTHER): Payer: Medicare Other | Admitting: Obstetrics & Gynecology

## 2021-03-19 ENCOUNTER — Other Ambulatory Visit: Payer: Self-pay

## 2021-03-19 VITALS — BP 125/68 | HR 67 | Ht 62.0 in | Wt 106.6 lb

## 2021-03-19 DIAGNOSIS — R140 Abdominal distension (gaseous): Secondary | ICD-10-CM | POA: Insufficient documentation

## 2021-03-19 DIAGNOSIS — R946 Abnormal results of thyroid function studies: Secondary | ICD-10-CM | POA: Diagnosis not present

## 2021-03-19 DIAGNOSIS — R1900 Intra-abdominal and pelvic swelling, mass and lump, unspecified site: Secondary | ICD-10-CM

## 2021-03-19 DIAGNOSIS — R1909 Other intra-abdominal and pelvic swelling, mass and lump: Secondary | ICD-10-CM

## 2021-03-19 DIAGNOSIS — M8589 Other specified disorders of bone density and structure, multiple sites: Secondary | ICD-10-CM | POA: Diagnosis not present

## 2021-03-19 DIAGNOSIS — Z1231 Encounter for screening mammogram for malignant neoplasm of breast: Secondary | ICD-10-CM | POA: Diagnosis not present

## 2021-03-19 LAB — T4, FREE: Free T4: 0.91 ng/dL (ref 0.61–1.12)

## 2021-03-19 LAB — TSH: TSH: 1.456 u[IU]/mL (ref 0.350–4.500)

## 2021-03-19 NOTE — Progress Notes (Signed)
GYNECOLOGY  VISIT  CC:   Palpitations, abdominal discomfort  HPI: 70 y.o. G71P0002 Married White or Caucasian female here for two issues that have been going on for several months.  First is heart palpitations and the second is some abdominal discomfort.  Had episode of palpitations in [place].  BP was 189/100. This was on a Sunday night.  She wondered if something was wrong with her home BP cuff.  She went to ER for a second time--[facility] ER.  BP was elevated there and 841 systolic.  Pt can't remember the diastolic number.  She was monitored for an hour.  Was given metoprolol.  She was diagnosed with PACs .  Had cardiac echo the day after the ER visit.  She does not have results of this.  The metoprolol seemed to lower her blood pressure too much.  So, she is now on 1/2 tablet (12.5mg  daily dosing).  Does have appt with Dr. Johney Frame on 03/30/2021 to establish care.   She's also feeling bloating and fullness in her lower abdomen.  She feel full early.  Feels like her clothes are tight.   Colonoscopy was 2016 with Dr. Olevia Perches.  Follow up 10 years recommended.  Pt has friend with primary peritoneal cancer with similar symptoms.  She did not know much about this cancer.  Has learned more.  D/w pt primary peritoneal cancer and relation to ovarian cancer.    Denies vaginal bleeding.  Last pap smear was 2020 and was negative.   Pt did have TSH done 2/28 and was 1.8 and also in the ER on 4/4 and was 4.8.  Questions if thyroid function is normal and what follow up is needed.  GYNECOLOGIC HISTORY: Patient's last menstrual period was 02/23/2008 (approximate).  Patient Active Problem List   Diagnosis Date Noted  . 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 Basal cell carcinoma of skin of right upper lip 08/24/2016    Past Medical History:  Diagnosis Date  . Abnormal Pap smear of cervix   . Arthritis   . BCC (basal cell carcinoma of skin)    on lip. Had Mohs surgery.  . Carpal tunnel syndrome    . Cataract    right eye 04/2014  . Fibroids   . HSV-2 infection   . Hypertension   . Palpitations   . PAT (paroxysmal atrial tachycardia) ([place]) 9/14  . Trigger finger   . Urge incontinence of urine     Past Surgical History:  Procedure Laterality Date  . BREAST ENHANCEMENT SURGERY  1973  . CARPAL TUNNEL RELEASE  10/26/2012   Procedure: CARPAL TUNNEL RELEASE;  Surgeon: Cammie Sickle., MD;  Location: [facility];  Service: Orthopedics;  Laterality: Left;  . CATARACT EXTRACTION  2013  . CATARACT EXTRACTION  6/15  . EYE SURGERY Right 03/30/2017   right eye lid BX  . FACIAL COSMETIC SURGERY  2005  . GREAT TOE ARTHRODESIS, INTERPHALANGEAL JOINT  2007   rt  . HYSTEROSCOPY WITH RESECTOSCOPE  6/09      . JOINT REPLACEMENT Right 07/29/2016   right thumb.  Dr. Amedeo Plenty.  Marland Kitchen MOHS SURGERY  09/2016   Dr. Sarajane Jews  . TONSILLECTOMY  12/07   T&A    MEDS:   Current Outpatient Medications on File Prior to Visit  Medication Sig Dispense Refill  . clobetasol cream (TEMOVATE) 6.60 % Apply 1 application topically as needed.     . fluocinonide (LIDEX) 0.05 % external solution     .  metoprolol tartrate (LOPRESSOR) 25 MG tablet Take 12.5 mg by mouth daily.    Marland Kitchen triamcinolone cream (KENALOG) 0.1 % as needed.    Marland Kitchen UNABLE TO FIND Doxycycline 20mg  qod    . valACYclovir (VALTREX) 500 MG tablet TAKE ONE TABLET EACH DAY 90 tablet 4  . Vitamin D, Ergocalciferol, (DRISDOL) 1.25 MG (50000 UT) CAPS capsule TAKE 1 CAPSULE BY MOUTH EVERY 30 DAYS ONCE MONTHLY (Patient taking differently: Take q 2 weeks) 3 capsule 4  . XIIDRA 5 % SOLN INSTILL ONE DROP IN EACH EYE EVERY 12 HOURS    . zaleplon (SONATA) 5 MG capsule Take 5 mg by mouth at bedtime as needed.     No current facility-administered medications on file prior to visit.    ALLERGIES: Patient has no known allergies.  Family History  Problem Relation Age of Onset  . Hypertension Mother   . Kidney Stones Father   . Diabetes Paternal  Aunt   . Brain cancer Maternal Grandmother   . Asthma Daughter   . Colon cancer Neg Hx     SH:  Married, non smoker  Review of Systems  Constitutional: Negative.   Gastrointestinal: Positive for abdominal distention.  Genitourinary: Negative for frequency, pelvic pain and vaginal bleeding.    PHYSICAL EXAMINATION:    BP 125/68   Pulse 67   Ht 5\' 2"  (1.575 m)   Wt 106 lb 9.6 oz (48.4 kg)   LMP 02/23/2008 (Approximate)   BMI 19.50 kg/m     General appearance: alert, cooperative and appears stated age Abdomen: soft, non-tender; bowel sounds normal; no masses,  no organomegaly, distension/swelling of abdomen is noted Lymph:  no inguinal LAD noted  Pelvic: External genitalia:  no lesions              Urethra:  normal appearing urethra with no masses, tenderness or lesions              Bartholins and Skenes: normal                 Vagina: normal appearing vagina with normal color and discharge, no lesions              Cervix: no lesions              Bimanual Exam:  Uterus:  normal size, contour, position, consistency, mobility, non-tender              Adnexa: no mass, fullness, tenderness  Chaperone, Acquanetta Chain, RN, was present for exam.  Assessment/Plan: 1. Abdominal bloating - US PELVIC COMPLETE WITH TRANSVAGINAL; Future - if negative, consider CT or GI referral  2. Other intra-abdominal and pelvic swelling, mass and lump  - CA 125; Future  3. Abnormal thyroid function test - TSH; Future - T4, free; Future

## 2021-03-20 ENCOUNTER — Encounter (HOSPITAL_BASED_OUTPATIENT_CLINIC_OR_DEPARTMENT_OTHER): Payer: Self-pay | Admitting: Obstetrics & Gynecology

## 2021-03-20 ENCOUNTER — Ambulatory Visit (HOSPITAL_BASED_OUTPATIENT_CLINIC_OR_DEPARTMENT_OTHER)
Admission: RE | Admit: 2021-03-20 | Discharge: 2021-03-20 | Disposition: A | Discharge status: Home / Self Care | Payer: Medicare Other | Source: Ambulatory Visit | Attending: Obstetrics & Gynecology | Admitting: Obstetrics & Gynecology

## 2021-03-20 ENCOUNTER — Ambulatory Visit (INDEPENDENT_AMBULATORY_CARE_PROVIDER_SITE_OTHER): Payer: Medicare Other | Admitting: Obstetrics & Gynecology

## 2021-03-20 DIAGNOSIS — R1900 Intra-abdominal and pelvic swelling, mass and lump, unspecified site: Secondary | ICD-10-CM | POA: Insufficient documentation

## 2021-03-20 DIAGNOSIS — D259 Leiomyoma of uterus, unspecified: Secondary | ICD-10-CM | POA: Diagnosis not present

## 2021-03-20 DIAGNOSIS — R140 Abdominal distension (gaseous): Secondary | ICD-10-CM | POA: Diagnosis not present

## 2021-03-20 DIAGNOSIS — R1909 Other intra-abdominal and pelvic swelling, mass and lump: Secondary | ICD-10-CM | POA: Diagnosis not present

## 2021-03-20 LAB — CA 125: Cancer Antigen (CA) 125: 24.1 U/mL (ref 0.0–38.1)

## 2021-03-21 ENCOUNTER — Encounter: Payer: Self-pay | Admitting: Physician Assistant

## 2021-03-21 ENCOUNTER — Other Ambulatory Visit (HOSPITAL_BASED_OUTPATIENT_CLINIC_OR_DEPARTMENT_OTHER): Payer: Self-pay | Admitting: Obstetrics & Gynecology

## 2021-03-21 ENCOUNTER — Encounter (HOSPITAL_BASED_OUTPATIENT_CLINIC_OR_DEPARTMENT_OTHER): Payer: Self-pay | Admitting: Obstetrics & Gynecology

## 2021-03-21 DIAGNOSIS — R1900 Intra-abdominal and pelvic swelling, mass and lump, unspecified site: Secondary | ICD-10-CM

## 2021-03-21 DIAGNOSIS — I471 Supraventricular tachycardia, unspecified: Secondary | ICD-10-CM | POA: Insufficient documentation

## 2021-03-21 DIAGNOSIS — R140 Abdominal distension (gaseous): Secondary | ICD-10-CM

## 2021-03-21 NOTE — Progress Notes (Signed)
GYNECOLOGY  VISIT  CC:   Review ultrasound  HPI: 70 y.o. G38P0002 Married White or Caucasian female here for review of ultrasound findings.  These have not been officially read yet so I reviewed images with pt personally.  Endometrium is thin.  Two fibroids noted, on appears calcified.  Ovaries are normal.  No free fluid noted.  Ca-125 was normal.  TSH and free T4 was normal.  Pt is still uncomfortable with distension/swelling of abdomen.  Feel pt needs to proceed with CT and probably GI evaluation.  Last colonoscopy was 2016 and with Dr. Olevia Perches.  She has retired so will need to communicate with [facility] to get pt scheduled and see if can get CT ordered/precert done.  D/w pt.  She is ready to proceed with additional evaluation as she just does not feel normal and abdominal complaints have been going on for several weeks.  GYNECOLOGIC HISTORY: Patient's last menstrual period was 02/23/2008 (approximate). Contraception: PMP Menopausal hormone therapy: none  Patient Active Problem List   Diagnosis Date Noted  . 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 Basal cell carcinoma of skin of right upper lip 08/24/2016    Past Medical History:  Diagnosis Date  . Abnormal Pap smear of cervix   . Arthritis   . BCC (basal cell carcinoma of skin)    on lip. Had Mohs surgery.  . Carpal tunnel syndrome   . Cataract    right eye 04/2014  . Fibroids   . HSV-2 infection   . Hypertension   . Palpitations   . PAT (paroxysmal atrial tachycardia) ([place]) 9/14  . Trigger finger   . Urge incontinence of urine     Past Surgical History:  Procedure Laterality Date  . BREAST ENHANCEMENT SURGERY  1973  . CARPAL TUNNEL RELEASE  10/26/2012   Procedure: CARPAL TUNNEL RELEASE;  Surgeon: Cammie Sickle., MD;  Location: [facility];  Service: Orthopedics;  Laterality: Left;  . CATARACT EXTRACTION  2013  . CATARACT EXTRACTION  6/15  . EYE SURGERY Right 03/30/2017   right eye lid BX  . FACIAL  COSMETIC SURGERY  2005  . GREAT TOE ARTHRODESIS, INTERPHALANGEAL JOINT  2007   rt  . HYSTEROSCOPY WITH RESECTOSCOPE  6/09      . JOINT REPLACEMENT Right 07/29/2016   right thumb.  Dr. Amedeo Plenty.  Marland Kitchen MOHS SURGERY  09/2016   Dr. Sarajane Jews  . TONSILLECTOMY  12/07   T&A    MEDS:   Current Outpatient Medications on File Prior to Visit  Medication Sig Dispense Refill  . clobetasol cream (TEMOVATE) 6.28 % Apply 1 application topically as needed.     . fluocinonide (LIDEX) 0.05 % external solution     . metoprolol tartrate (LOPRESSOR) 25 MG tablet Take 12.5 mg by mouth daily.    Marland Kitchen triamcinolone cream (KENALOG) 0.1 % as needed.    Marland Kitchen UNABLE TO FIND Doxycycline 20mg  qod    . valACYclovir (VALTREX) 500 MG tablet TAKE ONE TABLET EACH DAY 90 tablet 4  . Vitamin D, Ergocalciferol, (DRISDOL) 1.25 MG (50000 UT) CAPS capsule TAKE 1 CAPSULE BY MOUTH EVERY 30 DAYS ONCE MONTHLY (Patient taking differently: Take q 2 weeks) 3 capsule 4  . XIIDRA 5 % SOLN INSTILL ONE DROP IN EACH EYE EVERY 12 HOURS    . zaleplon (SONATA) 5 MG capsule Take 5 mg by mouth at bedtime as needed.     No current facility-administered medications on file prior to visit.  ALLERGIES: Patient has no known allergies.  Family History  Problem Relation Age of Onset  . Hypertension Mother   . Kidney Stones Father   . Diabetes Paternal Aunt   . Brain cancer Maternal Grandmother   . Asthma Daughter   . Colon cancer Neg Hx     SH:  Married, non smoker  Review of Systems  Gastrointestinal: Positive for abdominal distention.       Abdominal discomfort    PHYSICAL EXAMINATION:    LMP 02/23/2008 (Approximate)     General appearance: alert, cooperative and appears stated age Abdomen: soft, non-tender; bowel sounds normal; distention is present   Assessment/Plan: 1. Abdominal distension  - CT ABDOMEN PELVIS W CONTRAST; Future - Will communicate with GI about getting pt established with provider now that Dr. Olevia Perches has  retired.  2. Intra-abdominal and pelvic swelling, mass and lump, unspecified site

## 2021-03-21 NOTE — Progress Notes (Signed)
CT order placed.  Pt has been scheduled with Lindsey Robinson for 04/02/2021.

## 2021-03-22 ENCOUNTER — Telehealth: Payer: Self-pay | Admitting: *Deleted

## 2021-03-22 ENCOUNTER — Encounter: Payer: Self-pay | Admitting: *Deleted

## 2021-03-22 NOTE — Telephone Encounter (Signed)
Called Medicare to ensure that Prior Auth was not needed on CT that was ordered. No PA needed according to representative. Reference number [id number]

## 2021-03-31 NOTE — Progress Notes (Signed)
Cardiology Office Note:    Date:  04/02/2021   ID:  Lindsey Robinson, DOB [birth year], MRN [id number]  PCP:  Lajean Manes, MD   [facility] Providers Cardiologist:  None {   Referring MD: Lajean Manes, MD    History of Present Illness:    Lindsey Robinson is a 70 y.o. female with a hx of paroxysmal atrial tachycardia and HTN who was referred by Dr. Felipa Eth for further evaluation of palpitations and PAT.  The patient states that she has a history of known paroxysmal atrial tachycardia that was diagnosed about 8 years ago. She was prescribed metoprolol at that time but never took it as symptoms resolved. Had recurrent episode of palpitations in 02/2021 where she presented to the ER in [place]. She was diagnosed with elevated blood pressure and PACs at that time. 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 She was placed on metoprolol with resolution of symptoms and control of her blood pressures.   Today, the patient states that she feels much better since starting metoprolol 12.5mg  tartrate daily. No significant palpitations. No chest pain, SOB, lightheadedness, dizziness, orthopnea or LE edema.  Family history: Mother with Afib, aortic stenosis, ESRD. Father with history of CAD and MI. Brother with CAD s/p CABG in 34s.    Past Medical History:  Diagnosis Date  . Abnormal Pap smear of cervix   . Arthritis   . BCC (basal cell carcinoma of skin)    on lip. Had Mohs surgery.  . Carpal tunnel syndrome   . Cataract    right eye 04/2014  . Fibroids   . HSV-2 infection   . Hypertension   . Palpitations   . PAT (paroxysmal atrial tachycardia) ([place]) 9/14  . Trigger finger   . Urge incontinence of urine     Past Surgical History:  Procedure Laterality Date  . BREAST ENHANCEMENT SURGERY  1973  . CARPAL TUNNEL RELEASE  10/26/2012   Procedure: CARPAL TUNNEL RELEASE;  Surgeon: Cammie Sickle., MD;  Location: [place];  Service: Orthopedics;  Laterality: Left;  . CATARACT EXTRACTION  2013  . CATARACT EXTRACTION  6/15  . EYE SURGERY Right 03/30/2017   right eye lid BX  . FACIAL COSMETIC SURGERY  2005  . GREAT TOE ARTHRODESIS, INTERPHALANGEAL JOINT  2007   rt  . HYSTEROSCOPY WITH RESECTOSCOPE  6/09      . JOINT REPLACEMENT Right 07/29/2016   right thumb.  Dr. Amedeo Plenty.  Marland Kitchen MOHS SURGERY  09/2016   Dr. Sarajane Jews  . TONSILLECTOMY  12/07   T&A    Current Medications: Current Meds  Medication Sig  . clobetasol cream (TEMOVATE) 5.36 % Apply 1 application topically as needed.   . doxycycline (PERIOSTAT) 20 MG tablet Take 20 mg by mouth every other day.  . fluocinonide (LIDEX) 0.05 % external solution as needed.  . metoprolol tartrate (LOPRESSOR) 25 MG tablet Take 0.5 tablets (12.5 mg total) by mouth 2 (two) times daily.  Marland Kitchen triamcinolone cream (KENALOG) 0.1 % as needed.  . valACYclovir (VALTREX) 500 MG tablet TAKE ONE TABLET EACH DAY  . Vitamin D, Ergocalciferol, (DRISDOL) 1.25 MG (50000 UNIT) CAPS capsule Take 50,000 Units by mouth every 14 (fourteen) days.  Marland Kitchen XIIDRA 5 % SOLN INSTILL ONE DROP IN EACH EYE EVERY 12 HOURS  . zaleplon (SONATA) 5 MG capsule Take 5 mg by mouth at bedtime as  needed.  . [DISCONTINUED] metoprolol tartrate (LOPRESSOR) 25 MG tablet Take 12.5 mg by mouth daily.     Allergies:   Patient has no known allergies.   Social History   Socioeconomic History  . Marital status: Married    Spouse name: Not on file  . Number of children: Not on file  . Years of education: Not on file  . Highest education level: Not on file  Occupational History  . Not on file  Tobacco Use  . Smoking status: Former Smoker    Types: Cigarettes  . Smokeless tobacco: Never Used  Vaping Use  . Vaping Use: Never used  Substance and Sexual Activity  . Alcohol use: Yes    Alcohol/week: 14.0 standard drinks    Types: 14 Standard drinks or equivalent per  week  . Drug use: No  . Sexual activity: Not Currently    Partners: Male    Birth control/protection: Post-menopausal  Other Topics Concern  . Not on file  Social History Narrative  . Not on file   Social Determinants of Health   Financial Resource Strain: Not on file  Food Insecurity: Not on file  Transportation Needs: Not on file  Physical Activity: Not on file  Stress: Not on file  Social Connections: Not on file     Family History: The patient's family history includes Asthma in her daughter; Brain cancer in her maternal grandmother; Diabetes in her brother and paternal aunt; Heart attack in her father; Heart disease in her brother; Hypertension in her mother; Kidney Stones in her father; Kidney disease in her mother. There is no history of Colon cancer, Esophageal cancer, Pancreatic cancer, or Stomach cancer.  ROS:   Please see the history of present illness.    Review of Systems  Constitutional: Negative for chills and fever.  HENT: Negative for hearing loss.   Eyes: Negative for blurred vision and redness.  Respiratory: Negative for shortness of breath.   Cardiovascular: Positive for palpitations. Negative for chest pain, orthopnea, claudication, leg swelling and PND.  Gastrointestinal: Positive for constipation. Negative for heartburn and nausea.  Genitourinary: Negative for flank pain.  Musculoskeletal: Negative for falls.  Neurological: Negative for dizziness and loss of consciousness.  Endo/Heme/Allergies: Negative for polydipsia.  Psychiatric/Behavioral: Negative for substance abuse.    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  EKG:  EKG is ordered today.  The ekg ordered today demonstrates NSR with rsr' with mild right sided conduction delay, HR 81  Recent Labs: 03/19/2021:  TSH 1.456  Recent Lipid Panel    Component Value Date/Time   CHOL 197 09/12/2014 1542   TRIG 93 09/12/2014 1542   HDL 77 09/12/2014 1542   CHOLHDL 2.6 09/12/2014 1542   VLDL 19 09/12/2014 1542   LDLCALC 101 (H) 09/12/2014 1542     Risk Assessment/Calculations:       Physical Exam:    VS:  BP 108/64   Pulse 81   Ht 5\' 2"  (1.575 m)   Wt 107 lb 3.2 oz (48.6 kg)   LMP 02/23/2008 (Approximate)   SpO2 97%   BMI 19.61 kg/m     Wt Readings from Last 3 Encounters:  04/02/21 107 lb 3.2 oz (48.6 kg)  04/02/21 106 lb 4 oz (48.2 kg)  03/19/21 106 lb 9.6 oz (48.4 kg)  GEN:  Well nourished, well developed in no acute distress HEENT: Normal NECK: No JVD; No carotid bruits CARDIAC: RRR, no murmurs, rubs, gallops RESPIRATORY:  Clear to auscultation without rales, wheezing or rhonchi  ABDOMEN: Soft, non-tender, non-distended MUSCULOSKELETAL:  No edema; No deformity  SKIN: Warm and dry NEUROLOGIC:  Alert and oriented x 3 PSYCHIATRIC:  Normal affect   ASSESSMENT:    1. Paroxysmal supraventricular tachycardia ([place])   2. Family history of early CAD   3. Hyperlipidemia, unspecified hyperlipidemia type   4. Abnormal electrocardiogram (ECG) (EKG)     PLAN:    In order of problems listed above:  #Paroxysmal atrial tachycardia: Diagnosed by Cardiologist in [place] about 8 years ago. Was previously on metop but symptoms resolved and metop was stopped. Had recurrent palpitations in 02/2021 where she presented to [place] ED where she was diagnosed with PACs. She was restarted on metop tartrate 12.5mg  daily with resolution of symptoms. TTE with LVEF 65%, myxomatous MV with mild prolapse, mild-to-moderate MR, mild TR, mild RV enlargement with preserved RV function. 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 Patient asymptomatic and doing well. -Repeat TTE in 2024  #Elevated LDL #Family History of Premature CAD: [id number]. Brother with CAD requiring bypass in 47s. -Check coronary calcium score for risk stratification  Medication Adjustments/Labs and Tests Ordered: Current medicines are reviewed at length with the patient today.  Concerns regarding medicines are outlined above.  Orders Placed This Encounter  Procedures  . CT CARDIAC SCORING (SELF PAY ONLY)  . EKG 12-Lead   Meds ordered this encounter  Medications  . metoprolol tartrate (LOPRESSOR) 25 MG tablet    Sig: Take 0.5 tablets (12.5 mg total) by mouth 2 (two) times daily.    Dispense:  90 tablet    Refill:  3    Dose increase    Patient Instructions  Medication Instructions:   INCREASE YOUR METOPROLOL TARTRATE TO 12.5 MG BY MOUTH TWICE DAILY  *If you need a refill on your cardiac medications before your next appointment, please call your pharmacy*   Testing/Procedures:  CARDIAC CALCIUM SCORE TO BE DONE HERE IN THE OFFICE--SELF PAY   Follow-Up: At [facility], you and your health needs are our priority.  As part of our continuing mission to provide you with exceptional heart care, we have created designated Provider Care Teams.  These Care Teams include your primary Cardiologist (physician) and Advanced Practice Providers (APPs -  Physician Assistants and Nurse Practitioners) who all work together to provide you with the care you need, when you need it.  We recommend signing up for the patient portal called "MyChart".  Sign up information is provided on this After Visit Summary.  MyChart is used to connect with patients for Virtual Visits (Telemedicine).  Patients are able to view lab/test results, encounter notes, upcoming appointments, etc.  Non-urgent messages can be sent to your provider as well.   To learn more about what you can do  with MyChart, go to [email or web address].    Your next appointment:   1 year(s)  The format for your next appointment:   In Person  Provider:   Gwyndolyn Kaufman, MD        Signed, Freada Bergeron, MD  04/02/2021 3:03 PM  [facility]

## 2021-04-01 ENCOUNTER — Other Ambulatory Visit: Payer: Self-pay

## 2021-04-01 ENCOUNTER — Ambulatory Visit
Admission: RE | Admit: 2021-04-01 | Discharge: 2021-04-01 | Disposition: A | Discharge status: Home / Self Care | Payer: Medicare Other | Source: Ambulatory Visit | Attending: Obstetrics & Gynecology | Admitting: Obstetrics & Gynecology

## 2021-04-01 DIAGNOSIS — R1900 Intra-abdominal and pelvic swelling, mass and lump, unspecified site: Secondary | ICD-10-CM

## 2021-04-01 DIAGNOSIS — R140 Abdominal distension (gaseous): Secondary | ICD-10-CM

## 2021-04-01 MED ORDER — IOPAMIDOL (ISOVUE-300) INJECTION 61%
100.0000 mL | Freq: Once | INTRAVENOUS | Status: AC | PRN
  Administered 2021-04-01: 100 mL via INTRAVENOUS

## 2021-04-02 ENCOUNTER — Other Ambulatory Visit: Payer: Self-pay

## 2021-04-02 ENCOUNTER — Encounter: Payer: Self-pay | Admitting: Physician Assistant

## 2021-04-02 ENCOUNTER — Ambulatory Visit (INDEPENDENT_AMBULATORY_CARE_PROVIDER_SITE_OTHER): Payer: Medicare Other | Admitting: Physician Assistant

## 2021-04-02 ENCOUNTER — Ambulatory Visit (INDEPENDENT_AMBULATORY_CARE_PROVIDER_SITE_OTHER): Payer: Medicare Other | Admitting: Cardiology

## 2021-04-02 ENCOUNTER — Encounter: Payer: Self-pay | Admitting: Cardiology

## 2021-04-02 VITALS — BP 108/64 | HR 81 | Ht 62.0 in | Wt 107.2 lb

## 2021-04-02 VITALS — BP 94/68 | HR 72 | Ht 62.0 in | Wt 106.2 lb

## 2021-04-02 DIAGNOSIS — R9431 Abnormal electrocardiogram [ECG] [EKG]: Secondary | ICD-10-CM | POA: Diagnosis not present

## 2021-04-02 DIAGNOSIS — E785 Hyperlipidemia, unspecified: Secondary | ICD-10-CM

## 2021-04-02 DIAGNOSIS — Z8249 Family history of ischemic heart disease and other diseases of the circulatory system: Secondary | ICD-10-CM | POA: Diagnosis not present

## 2021-04-02 DIAGNOSIS — K5900 Constipation, unspecified: Secondary | ICD-10-CM

## 2021-04-02 DIAGNOSIS — R140 Abdominal distension (gaseous): Secondary | ICD-10-CM | POA: Diagnosis not present

## 2021-04-02 DIAGNOSIS — I471 Supraventricular tachycardia: Secondary | ICD-10-CM | POA: Diagnosis not present

## 2021-04-02 MED ORDER — METOPROLOL TARTRATE 25 MG PO TABS: 12.5000 mg | ORAL_TABLET | Freq: Two times a day (BID) | ORAL | 3 refills | Status: DC

## 2021-04-02 NOTE — Patient Instructions (Signed)
Medication Instructions:   INCREASE YOUR METOPROLOL TARTRATE TO 12.5 MG BY MOUTH TWICE DAILY  *If you need a refill on your cardiac medications before your next appointment, please call your pharmacy*   Testing/Procedures:  CARDIAC CALCIUM SCORE TO BE DONE HERE IN THE OFFICE--SELF PAY   Follow-Up: At [facility], you and your health needs are our priority.  As part of our continuing mission to provide you with exceptional heart care, we have created designated Provider Care Teams.  These Care Teams include your primary Cardiologist (physician) and Advanced Practice Providers (APPs -  Physician Assistants and Nurse Practitioners) who all work together to provide you with the care you need, when you need it.  We recommend signing up for the patient portal called "MyChart".  Sign up information is provided on this After Visit Summary.  MyChart is used to connect with patients for Virtual Visits (Telemedicine).  Patients are able to view lab/test results, encounter notes, upcoming appointments, etc.  Non-urgent messages can be sent to your provider as well.   To learn more about what you can do with MyChart, go to [email or web address].    Your next appointment:   1 year(s)  The format for your next appointment:   In Person  Provider:   Gwyndolyn Kaufman, MD

## 2021-04-02 NOTE — Progress Notes (Signed)
Chief Complaint: Bloating  HPI:    Lindsey Robinson is a 70 year old female with a past medical history, previously known to Dr. Olevia Perches, as listed below, who was referred to me by Lajean Manes, MD for a complaint of bloating.      03/16/2015 colonoscopy for screening was normal.  Repeat recommended in 10 years.    03/19/2021 TSH, T4, CA125 all normal.    03/20/2021 ultrasound of the pelvis for abdominal swelling and bloating with 2 small fibroids measuring up to 0.7 cm and otherwise unremarkable.    03/20/2021 patient saw gynecologist and at that time describing uncomfortable with distention/swelling of the abdomen.  Described that she felt full early and like her clothes were tight.  Apparently described a friend with primary peritoneal cancer with similar symptoms.    04/01/2021 CT of the abdomen pelvis with contrast showed no acute findings in the abdomen/pelvis, moderate fecal retention throughout the colon, no evidence of bowel obstruction, aortic atherosclerosis.    Today, patient presents to clinic and tells me that really towards mid February/late February she started to notice a lot of bloating and distention in her abdomen related to a change to constipation.  Also describes a "pressure", because "none of my clothes fit".  Tells me around that time she saw her doctors down in [place] and they told her to increase fiber in her diet and start MiraLAX as well as a daily probiotic, Florastor.  Tells me that symptoms seemed to ease then but she came back to [place] for a long weekend and was seen by her PCP and continued to discuss the symptoms, then followed with her gynecologist.  Daisy Lazar me that really now she feels some better.  Especially after drinking the contrast for CT yesterday and passing a lot of gas, but does continue with some decreased bowel movements.    Denies fever, chills, blood in her stool or weight loss.  Past Medical History:  Diagnosis Date  . Abnormal Pap smear of cervix    . Arthritis   . BCC (basal cell carcinoma of skin)    on lip. Had Mohs surgery.  . Carpal tunnel syndrome   . Cataract    right eye 04/2014  . Fibroids   . HSV-2 infection   . Hypertension   . Palpitations   . PAT (paroxysmal atrial tachycardia) ([place]) 9/14  . Trigger finger   . Urge incontinence of urine     Past Surgical History:  Procedure Laterality Date  . BREAST ENHANCEMENT SURGERY  1973  . CARPAL TUNNEL RELEASE  10/26/2012   Procedure: CARPAL TUNNEL RELEASE;  Surgeon: Cammie Sickle., MD;  Location: [place];  Service: Orthopedics;  Laterality: Left;  . CATARACT EXTRACTION  2013  . CATARACT EXTRACTION  6/15  . EYE SURGERY Right 03/30/2017   right eye lid BX  . FACIAL COSMETIC SURGERY  2005  . GREAT TOE ARTHRODESIS, INTERPHALANGEAL JOINT  2007   rt  . HYSTEROSCOPY WITH RESECTOSCOPE  6/09      . JOINT REPLACEMENT Right 07/29/2016   right thumb.  Dr. Amedeo Plenty.  Marland Kitchen MOHS SURGERY  09/2016   Dr. Sarajane Jews  . TONSILLECTOMY  12/07   T&A    Current Outpatient Medications  Medication Sig Dispense Refill  . clobetasol cream (TEMOVATE) 0.27 % Apply 1 application topically as needed.     . fluocinonide (LIDEX) 0.05 % external solution     . metoprolol tartrate (LOPRESSOR) 25 MG tablet Take 12.5  mg by mouth daily.    Marland Kitchen triamcinolone cream (KENALOG) 0.1 % as needed.    Marland Kitchen UNABLE TO FIND Doxycycline 20mg  qod    . valACYclovir (VALTREX) 500 MG tablet TAKE ONE TABLET EACH DAY 90 tablet 4  . Vitamin D, Ergocalciferol, (DRISDOL) 1.25 MG (50000 UT) CAPS capsule TAKE 1 CAPSULE BY MOUTH EVERY 30 DAYS ONCE MONTHLY (Patient taking differently: Take q 2 weeks) 3 capsule 4  . XIIDRA 5 % SOLN INSTILL ONE DROP IN EACH EYE EVERY 12 HOURS    . zaleplon (SONATA) 5 MG capsule Take 5 mg by mouth at bedtime as needed.     No current facility-administered medications for this visit.    Allergies as of 04/02/2021  . (No Known Allergies)    Family History  Problem Relation Age  of Onset  . Hypertension Mother   . Kidney Stones Father   . Diabetes Paternal Aunt   . Brain cancer Maternal Grandmother   . Asthma Daughter   . Colon cancer Neg Hx     Social History   Socioeconomic History  . Marital status: Married    Spouse name: Not on file  . Number of children: Not on file  . Years of education: Not on file  . Highest education level: Not on file  Occupational History  . Not on file  Tobacco Use  . Smoking status: Former Research scientist (life sciences)  . Smokeless tobacco: Never Used  Vaping Use  . Vaping Use: Never used  Substance and Sexual Activity  . Alcohol use: Yes    Alcohol/week: 14.0 standard drinks    Types: 14 Standard drinks or equivalent per week  . Drug use: No  . Sexual activity: Not Currently    Partners: Male    Birth control/protection: Post-menopausal  Other Topics Concern  . Not on file  Social History Narrative  . Not on file   Social Determinants of Health   Financial Resource Strain: Not on file  Food Insecurity: Not on file  Transportation Needs: Not on file  Physical Activity: Not on file  Stress: Not on file  Social Connections: Not on file  Intimate Partner Violence: Not on file    Review of Systems:    Constitutional: No weight loss, fever or chills Skin: No rash Cardiovascular: No chest pain   Respiratory: No SOB or cough Gastrointestinal: See HPI and otherwise negative Genitourinary: No dysuria Neurological: No headache Musculoskeletal: No new muscle or joint pain Hematologic: No bleeding  Psychiatric: No history of depression or anxiety   Physical Exam:  Vital signs: BP 94/68 (BP Location: Left Arm, Patient Position: Sitting, Cuff Size: Normal)   Pulse 72   Ht 5\' 2"  (1.575 m)   Wt 106 lb 4 oz (48.2 kg)   LMP 02/23/2008 (Approximate)   BMI 19.43 kg/m   Constitutional:   Pleasant Caucasian female appears to be in NAD, Well developed, Well nourished, alert and cooperative Head:  Normocephalic and atraumatic. Eyes:    PEERL, EOMI. No icterus. Conjunctiva pink. Ears:  Normal auditory acuity. Neck:  Supple Throat: Oral cavity and pharynx without inflammation, swelling or lesion.  Respiratory: Respirations even and unlabored. Lungs clear to auscultation bilaterally.   No wheezes, crackles, or rhonchi.  Cardiovascular: Normal S1, S2. No MRG. Regular rate and rhythm. No peripheral edema, cyanosis or pallor.  Gastrointestinal:  Soft, nondistended, nontender. No rebound or guarding. Normal bowel sounds. No appreciable masses or hepatomegaly. Rectal:  Not performed.  Msk:  Symmetrical without gross deformities.  Without edema, no deformity or joint abnormality.  Neurologic:  Alert and  oriented x4;  grossly normal neurologically.  Skin:   Dry and intact without significant lesions or rashes. Psychiatric: Demonstrates good judgement and reason without abnormal affect or behaviors.  See HPI for recent labs and imaging.  Assessment: 1.  Constipation: With bloating abdominal distention, better now with an increase in fiber and a probiotic, CT 5/9 normal other than a moderate stool burden  Plan: 1.  Discussed constipation with the patient today.  Would continue to recommend high-fiber diet and water as well as exercise.  Would also recommend that she restart MiraLAX on a daily basis as it looks like she still has moderate stool in her colon per CT yesterday.  Discussed titration of this up to 4 times a day if necessary. 2.  Patient tells me she still does have some gas.  Told her to trial stopping Florastor she was using a very high dose of this over the last few months. 3.  Explained to patient that if she continues to feel well and has no further problems then there is no need for earlier colonoscopy, but if symptoms return or the MiraLAX is unhelpful then she should call and let us know and we may discuss an earlier colonoscopy. 4.  Patient assigned to Dr. Henrene Pastor today.  She will return to clinic as needed.  Ellouise Newer, PA-C [facility] 04/02/2021, 10:35 AM  Cc: Lajean Manes, MD

## 2021-04-02 NOTE — Patient Instructions (Signed)
If you are age 71 or older, your body mass index should be between 23-30. Your Body mass index is 19.43 kg/m. If this is out of the aforementioned range listed, please consider follow up with your Primary Care Provider.  If you are age 56 or younger, your body mass index should be between 19-25. Your Body mass index is 19.43 kg/m. If this is out of the aformentioned range listed, please consider follow up with your Primary Care Provider.   Stop Florastor.  Start Miralax once daily.  Thank you for choosing me and [facility].  Ellouise Newer, PA-C

## 2021-04-02 NOTE — Progress Notes (Signed)
GI physician assistant assessment and plans noted

## 2021-04-09 DIAGNOSIS — C44519 Basal cell carcinoma of skin of other part of trunk: Secondary | ICD-10-CM | POA: Diagnosis not present

## 2021-04-09 DIAGNOSIS — D2372 Other benign neoplasm of skin of left lower limb, including hip: Secondary | ICD-10-CM | POA: Diagnosis not present

## 2021-04-09 DIAGNOSIS — L821 Other seborrheic keratosis: Secondary | ICD-10-CM | POA: Diagnosis not present

## 2021-04-09 DIAGNOSIS — L812 Freckles: Secondary | ICD-10-CM | POA: Diagnosis not present

## 2021-04-09 DIAGNOSIS — D485 Neoplasm of uncertain behavior of skin: Secondary | ICD-10-CM | POA: Diagnosis not present

## 2021-04-09 DIAGNOSIS — Z85828 Personal history of other malignant neoplasm of skin: Secondary | ICD-10-CM | POA: Diagnosis not present

## 2021-05-06 ENCOUNTER — Other Ambulatory Visit: Payer: Self-pay

## 2021-05-06 ENCOUNTER — Ambulatory Visit (INDEPENDENT_AMBULATORY_CARE_PROVIDER_SITE_OTHER)
Admission: RE | Admit: 2021-05-06 | Discharge: 2021-05-06 | Disposition: A | Discharge status: Home / Self Care | Payer: Self-pay | Source: Ambulatory Visit | Attending: Cardiology | Admitting: Cardiology

## 2021-05-06 DIAGNOSIS — R9431 Abnormal electrocardiogram [ECG] [EKG]: Secondary | ICD-10-CM

## 2021-05-06 DIAGNOSIS — E785 Hyperlipidemia, unspecified: Secondary | ICD-10-CM

## 2021-05-06 DIAGNOSIS — I471 Supraventricular tachycardia: Secondary | ICD-10-CM

## 2021-05-06 DIAGNOSIS — Z8249 Family history of ischemic heart disease and other diseases of the circulatory system: Secondary | ICD-10-CM

## 2021-05-09 ENCOUNTER — Telehealth: Payer: Self-pay | Admitting: *Deleted

## 2021-05-09 ENCOUNTER — Encounter: Payer: Self-pay | Admitting: Cardiology

## 2021-05-09 DIAGNOSIS — I2510 Atherosclerotic heart disease of native coronary artery without angina pectoris: Secondary | ICD-10-CM

## 2021-05-09 DIAGNOSIS — E785 Hyperlipidemia, unspecified: Secondary | ICD-10-CM

## 2021-05-09 DIAGNOSIS — Z79899 Other long term (current) drug therapy: Secondary | ICD-10-CM

## 2021-05-09 DIAGNOSIS — Z8249 Family history of ischemic heart disease and other diseases of the circulatory system: Secondary | ICD-10-CM

## 2021-05-09 NOTE — Telephone Encounter (Signed)
error 

## 2021-05-09 NOTE — Telephone Encounter (Signed)
Freada Bergeron, MD  05/07/2021 11:11 AM EDT      Her calcium score is 15 which is the 48% for her age, gender-matched controls. She has the option of either continuing lifestyle modifications with diet andexercise OR start a very low dose crestor 5mg  daily to help get her LDL<100.      The patient has been notified of the result and verbalized understanding.  All questions (if any) were answered.  Pt states she will try and manage this by continuing lifestyle modifications with diet and exercise.  Advised her that goal is to have her LDL <100.  Informed the pt that being her last lipid done was back in Nov 2021, we will repeat this again at our office on 10/08/21, and we can reassess her lipids at that time, to determine if lifestyle modification worked or will low dose statin need to be initiated at that time. Lab appt for fasting lipids made on 10/08/21.  Pt is aware to come fasting to this lab appt.  Pt verbalized understanding and agrees with this plan.

## 2021-07-04 DIAGNOSIS — H25013 Cortical age-related cataract, bilateral: Secondary | ICD-10-CM | POA: Diagnosis not present

## 2021-09-03 DIAGNOSIS — Z85828 Personal history of other malignant neoplasm of skin: Secondary | ICD-10-CM | POA: Diagnosis not present

## 2021-09-03 DIAGNOSIS — L650 Telogen effluvium: Secondary | ICD-10-CM | POA: Diagnosis not present

## 2021-10-08 ENCOUNTER — Other Ambulatory Visit: Payer: Medicare Other | Admitting: *Deleted

## 2021-10-08 ENCOUNTER — Other Ambulatory Visit: Payer: Self-pay

## 2021-10-08 DIAGNOSIS — E785 Hyperlipidemia, unspecified: Secondary | ICD-10-CM

## 2021-10-08 DIAGNOSIS — I2510 Atherosclerotic heart disease of native coronary artery without angina pectoris: Secondary | ICD-10-CM

## 2021-10-08 DIAGNOSIS — Z79899 Other long term (current) drug therapy: Secondary | ICD-10-CM

## 2021-10-08 DIAGNOSIS — Z8249 Family history of ischemic heart disease and other diseases of the circulatory system: Secondary | ICD-10-CM

## 2021-10-08 LAB — LIPID PANEL
Chol/HDL Ratio: 2.8 ratio (ref 0.0–4.4)
Cholesterol, Total: 208 mg/dL — ABNORMAL HIGH (ref 100–199)
HDL: 74 mg/dL (ref >39)
LDL Chol Calc (NIH): 123 mg/dL — ABNORMAL HIGH (ref 0–99)
Triglycerides: 60 mg/dL (ref 0–149)
VLDL Cholesterol Cal: 11 mg/dL (ref 5–40)

## 2021-10-10 ENCOUNTER — Encounter: Payer: Self-pay | Admitting: Cardiology

## 2021-10-10 DIAGNOSIS — Z8249 Family history of ischemic heart disease and other diseases of the circulatory system: Secondary | ICD-10-CM

## 2021-10-10 DIAGNOSIS — Z79899 Other long term (current) drug therapy: Secondary | ICD-10-CM

## 2021-10-10 DIAGNOSIS — E785 Hyperlipidemia, unspecified: Secondary | ICD-10-CM

## 2021-10-10 DIAGNOSIS — I2510 Atherosclerotic heart disease of native coronary artery without angina pectoris: Secondary | ICD-10-CM

## 2021-10-10 MED ORDER — ROSUVASTATIN CALCIUM 5 MG PO TABS: 5.0000 mg | ORAL_TABLET | Freq: Every day | ORAL | 1 refills | Status: DC

## 2021-10-10 NOTE — Telephone Encounter (Signed)
Lindsey Bergeron, MD  [name], LPN Her LDL cholesterol is still a little elevated at 123 with goal <100. Of she is amenable, would recommend starting her on crestor 5mg  daily and repeat lipids in 6-8 weeks to make sure we are at goal.  Pt aware of recommendations based on lab results per Dr. Johney Frame, via [place].  Pt agreed to start taking crestor 5 mg po daily.  Med was sent to her confirmed pharmacy of choice.  Pt aware I will schedule her on 12/05/21 to have repeat lipids.  She is aware to come fasting to this lab appointment.  Pt agreed to plan.

## 2021-10-14 DIAGNOSIS — Z23 Encounter for immunization: Secondary | ICD-10-CM | POA: Diagnosis not present

## 2021-10-14 DIAGNOSIS — I471 Supraventricular tachycardia: Secondary | ICD-10-CM | POA: Diagnosis not present

## 2021-10-14 DIAGNOSIS — Z1331 Encounter for screening for depression: Secondary | ICD-10-CM | POA: Diagnosis not present

## 2021-10-14 DIAGNOSIS — E559 Vitamin D deficiency, unspecified: Secondary | ICD-10-CM | POA: Diagnosis not present

## 2021-10-14 DIAGNOSIS — E7800 Pure hypercholesterolemia, unspecified: Secondary | ICD-10-CM | POA: Diagnosis not present

## 2021-10-14 DIAGNOSIS — Z0000 Encounter for general adult medical examination without abnormal findings: Secondary | ICD-10-CM | POA: Diagnosis not present

## 2021-10-14 DIAGNOSIS — Z79899 Other long term (current) drug therapy: Secondary | ICD-10-CM | POA: Diagnosis not present

## 2021-10-15 DIAGNOSIS — D2262 Melanocytic nevi of left upper limb, including shoulder: Secondary | ICD-10-CM | POA: Diagnosis not present

## 2021-10-15 DIAGNOSIS — C44722 Squamous cell carcinoma of skin of right lower limb, including hip: Secondary | ICD-10-CM | POA: Diagnosis not present

## 2021-10-15 DIAGNOSIS — D485 Neoplasm of uncertain behavior of skin: Secondary | ICD-10-CM | POA: Diagnosis not present

## 2021-10-15 DIAGNOSIS — D2372 Other benign neoplasm of skin of left lower limb, including hip: Secondary | ICD-10-CM | POA: Diagnosis not present

## 2021-10-15 DIAGNOSIS — L821 Other seborrheic keratosis: Secondary | ICD-10-CM | POA: Diagnosis not present

## 2021-10-15 DIAGNOSIS — Z85828 Personal history of other malignant neoplasm of skin: Secondary | ICD-10-CM | POA: Diagnosis not present

## 2021-10-15 DIAGNOSIS — D2371 Other benign neoplasm of skin of right lower limb, including hip: Secondary | ICD-10-CM | POA: Diagnosis not present

## 2021-10-15 DIAGNOSIS — L570 Actinic keratosis: Secondary | ICD-10-CM | POA: Diagnosis not present

## 2021-10-15 DIAGNOSIS — L720 Epidermal cyst: Secondary | ICD-10-CM | POA: Diagnosis not present

## 2021-10-24 ENCOUNTER — Other Ambulatory Visit: Payer: Self-pay

## 2021-10-24 ENCOUNTER — Ambulatory Visit (INDEPENDENT_AMBULATORY_CARE_PROVIDER_SITE_OTHER): Payer: Medicare Other | Admitting: Obstetrics & Gynecology

## 2021-10-24 ENCOUNTER — Encounter (HOSPITAL_BASED_OUTPATIENT_CLINIC_OR_DEPARTMENT_OTHER): Payer: Self-pay | Admitting: Obstetrics & Gynecology

## 2021-10-24 VITALS — BP 146/70 | HR 58 | Ht 61.5 in | Wt 106.4 lb

## 2021-10-24 DIAGNOSIS — M8580 Other specified disorders of bone density and structure, unspecified site: Secondary | ICD-10-CM

## 2021-10-24 DIAGNOSIS — Z780 Asymptomatic menopausal state: Secondary | ICD-10-CM

## 2021-10-24 DIAGNOSIS — B009 Herpesviral infection, unspecified: Secondary | ICD-10-CM | POA: Diagnosis not present

## 2021-10-24 DIAGNOSIS — E7800 Pure hypercholesterolemia, unspecified: Secondary | ICD-10-CM | POA: Insufficient documentation

## 2021-10-24 DIAGNOSIS — T8544XA Capsular contracture of breast implant, initial encounter: Secondary | ICD-10-CM

## 2021-10-24 DIAGNOSIS — Z9189 Other specified personal risk factors, not elsewhere classified: Secondary | ICD-10-CM

## 2021-10-24 MED ORDER — VALACYCLOVIR HCL 500 MG PO TABS: ORAL_TABLET | ORAL | 4 refills | Status: DC

## 2021-10-24 NOTE — Progress Notes (Signed)
70 y.o. G27P0002 Married White or Caucasian female here for breast and pelvic exam.  Reports she is doing well.  Had a lot of bloating this spring.  Has figured out diary does cause some of these symptoms.  She is a lot more careful with dairy in her diet.  Denies vaginal bleeding.    Has breast implants that are about 71 years old.  Right breast feels larger and harder than the left breast.    Both daughters are expecting boys in February.    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  Does well with this.  Needs refill.  Patient's last menstrual period was 02/23/2008 (approximate).          Sexually active: No.  H/O STD:  no  Health Maintenance: PCP:  Dr. Felipa Eth.  Last wellness appt was 10/14/2021.  Did blood work at that appt:  yes Vaccines are up to date:  did first two Covid vaccinations Colonoscopy:  03/16/2015, follow up 10 years MMG:  02/27/2021 Negative BMD:  she is sure she's had one in the past year or so Last pap smear:  08/09/2019 Negative.   H/o abnormal pap smear:  remote hx   reports that she has quit smoking. Her smoking use included cigarettes. She has never used smokeless tobacco. She reports current alcohol use of about 14.0 standard drinks per week. She reports that she does not use drugs.  Past Medical History:  Diagnosis Date   Abnormal Pap smear of cervix    Arthritis    BCC (basal cell carcinoma of skin)    on lip. Had Mohs surgery.   Carpal tunnel syndrome    Cataract    right eye 04/2014   Fibroids    HSV-2 infection    Hypertension    Palpitations    PAT (paroxysmal atrial tachycardia) ([place]) 9/14   Trigger finger    Urge incontinence of urine     Past Surgical History:  Procedure Laterality Date   BREAST ENHANCEMENT SURGERY  1973   CARPAL TUNNEL RELEASE  10/26/2012   Procedure: CARPAL TUNNEL RELEASE;  Surgeon: Cammie Sickle., MD;  Location: [place];  Service: Orthopedics;  Laterality: Left;   CATARACT EXTRACTION  2013   CATARACT  EXTRACTION  6/15   EYE SURGERY Right 03/30/2017   right eye lid BX   FACIAL COSMETIC SURGERY  2005   GREAT TOE ARTHRODESIS, INTERPHALANGEAL JOINT  2007   rt   HYSTEROSCOPY WITH RESECTOSCOPE  6/09       JOINT REPLACEMENT Right 07/29/2016   right thumb.  Dr. Amedeo Plenty.   MOHS SURGERY  09/2016   Dr. Sarajane Jews   TONSILLECTOMY  12/07   T&A    Current Outpatient Medications  Medication Sig Dispense Refill   clobetasol cream (TEMOVATE) 7.25 % Apply 1 application topically as needed.      doxycycline (PERIOSTAT) 20 MG tablet Take 20 mg by mouth every other day.     fluocinonide (LIDEX) 0.05 % external solution as needed.     metoprolol tartrate (LOPRESSOR) 25 MG tablet Take 0.5 tablets (12.5 mg total) by mouth 2 (two) times daily. 90 tablet 3   rosuvastatin (CRESTOR) 5 MG tablet Take 1 tablet (5 mg total) by mouth daily. 90 tablet 1   Vitamin D, Ergocalciferol, (DRISDOL) 1.25 MG (50000 UNIT) CAPS capsule Take 50,000 Units by mouth every 14 (fourteen) days.     XIIDRA 5 % SOLN INSTILL ONE DROP IN [facility] EYE EVERY 12 HOURS  zaleplon (SONATA) 5 MG capsule Take 5 mg by mouth at bedtime as needed.     triamcinolone cream (KENALOG) 0.1 % as needed. (Patient not taking: Reported on 10/24/2021)     valACYclovir (VALTREX) 500 MG tablet TAKE ONE TABLET EACH DAY 90 tablet 4   No current facility-administered medications for this visit.    Family History  Problem Relation Age of Onset   Hypertension Mother    Kidney disease Mother    Kidney Stones Father    Heart attack Father    Diabetes Paternal Aunt    Brain cancer Maternal Grandmother    Asthma Daughter    Diabetes Brother    Heart disease Brother    Colon cancer Neg Hx    Esophageal cancer Neg Hx    Pancreatic cancer Neg Hx    Stomach cancer Neg Hx     Review of Systems  All other systems reviewed and are negative.  Exam:   BP (!) 146/70 (BP Location: Right Arm, Patient Position: Sitting, Cuff Size: Normal)   Pulse (!) 58   Ht 5'  1.5" (1.562 m)   Wt 106 lb 6.6 oz (48.3 kg)   LMP 02/23/2008 (Approximate)   BMI 19.78 kg/m   Height: 5' 1.5" (156.2 cm)  General appearance: alert, cooperative and appears stated age Breasts: normal appearance, no masses or tenderness, right breast is encapsulated Abdomen: soft, non-tender; bowel sounds normal; no masses,  no organomegaly Lymph nodes: Cervical, supraclavicular, and axillary nodes normal.  No abnormal inguinal nodes palpated Neurologic: Grossly normal  Pelvic: External genitalia:  no lesions              Urethra:  normal appearing urethra with no masses, tenderness or lesions              Bartholins and Skenes: normal                 Vagina: normal appearing vagina with atrophic changes and no discharge, no lesions              Cervix: no lesions              Pap taken: No. Bimanual Exam:  Uterus:  normal size, contour, position, consistency, mobility, non-tender              Adnexa: normal adnexa and no mass, fullness, tenderness               Rectovaginal: Confirms               Anus:  normal sphincter tone, no lesions  Chaperone, Octaviano Batty, CMA, was present for exam.  Assessment/Plan: 1. GYN exam for high-risk Medicare patient - pap neg 2020.  Not indicated today - MMG 02/2021 - colonoscopy 2016, follow up 10 years - BMD is due but pt is sure she had one this year.  Will call for report. - lab work done with Dr. Felipa Eth - vaccinations updated/reviewed  2. HSV-2 (herpes simplex virus 2) infection - valACYclovir (VALTREX) 500 MG tablet; TAKE ONE TABLET EACH DAY  Dispense: 90 tablet; Refill: 4  3. Post-menopausal - no HRT  4. Osteopenia, unspecified location  5. Capsular contracture of breast implant, initial encounter - pt reassured about breast concerns. No additional imaging recommended.

## 2021-10-28 ENCOUNTER — Ambulatory Visit: Payer: Medicare Other | Admitting: Obstetrics and Gynecology

## 2021-11-04 DIAGNOSIS — L570 Actinic keratosis: Secondary | ICD-10-CM | POA: Diagnosis not present

## 2021-11-04 DIAGNOSIS — Z85828 Personal history of other malignant neoplasm of skin: Secondary | ICD-10-CM | POA: Diagnosis not present

## 2021-11-04 DIAGNOSIS — K130 Diseases of lips: Secondary | ICD-10-CM | POA: Diagnosis not present

## 2021-12-05 ENCOUNTER — Other Ambulatory Visit: Payer: Medicare Other

## 2021-12-26 ENCOUNTER — Other Ambulatory Visit: Payer: Medicare Other

## 2021-12-31 ENCOUNTER — Other Ambulatory Visit: Payer: Medicare Other | Admitting: *Deleted

## 2021-12-31 ENCOUNTER — Other Ambulatory Visit: Payer: Self-pay

## 2021-12-31 DIAGNOSIS — Z8249 Family history of ischemic heart disease and other diseases of the circulatory system: Secondary | ICD-10-CM | POA: Diagnosis not present

## 2021-12-31 DIAGNOSIS — Z79899 Other long term (current) drug therapy: Secondary | ICD-10-CM

## 2021-12-31 DIAGNOSIS — I2510 Atherosclerotic heart disease of native coronary artery without angina pectoris: Secondary | ICD-10-CM | POA: Diagnosis not present

## 2021-12-31 DIAGNOSIS — E785 Hyperlipidemia, unspecified: Secondary | ICD-10-CM

## 2021-12-31 LAB — LIPID PANEL
Chol/HDL Ratio: 2.5 ratio (ref 0.0–4.4)
Cholesterol, Total: 145 mg/dL (ref 100–199)
HDL: 57 mg/dL (ref >39)
LDL Chol Calc (NIH): 75 mg/dL (ref 0–99)
Triglycerides: 64 mg/dL (ref 0–149)
VLDL Cholesterol Cal: 13 mg/dL (ref 5–40)

## 2022-03-18 NOTE — Progress Notes (Deleted)
?Cardiology Office Note:   ? ?Date:  03/18/2022  ? ?ID:  Lindsey Robinson, DOB September 01, 1951, MRN [id number] ? ?PCP:  Lajean Manes, MD ?  ?[facility] Providers ?Cardiologist:  None { ? ? ?Referring MD: Lajean Manes, MD  ? ? ?History of Present Illness:   ? ?Lindsey Robinson is a 71 y.o. female with a hx of paroxysmal atrial tachycardia and HTN who presents to clinic for follow-up. ? ?The patient has a known history paroxysmal atrial tachycardia that was diagnosed about 8 years ago. She was prescribed metoprolol at that time but never took it as symptoms resolved. Had recurrent episode of palpitations in 02/2021 where she presented to the ER in [place]. She was diagnosed with elevated blood pressure and PACs at that time. 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 She was placed on metoprolol with resolution of symptoms and control of her blood pressures.  ? ?Was last seen in clinic on 03/2021 where she was doing much better on metoprolol. Ca score 04/2021 15 (48%). Was started on crestor '5mg'$  at that time. ? ?Today, *** ? ?Family history: Mother with Afib, aortic stenosis, ESRD. Father with history of CAD and MI. Brother with CAD s/p CABG in 1s.   ? ?Past Medical History:  ?Diagnosis Date  ? Abnormal Pap smear of cervix   ? Arthritis   ? BCC (basal cell carcinoma of skin)   ? on lip. Had Mohs surgery.  ? Carpal tunnel syndrome   ? Cataract   ? right eye 04/2014  ? Fibroids   ? HSV-2 infection   ? Hypertension   ? Palpitations   ? PAT (paroxysmal atrial tachycardia) ([place]) 9/14  ? Trigger finger   ? Urge incontinence of urine   ? ? ?Past Surgical History:  ?Procedure Laterality Date  ? [employer]  ? CARPAL TUNNEL RELEASE  10/26/2012  ? Procedure: CARPAL TUNNEL RELEASE;  Surgeon: Cammie Sickle., MD;  Location: [place];  Service: Orthopedics;  Laterality: Left;  ? CATARACT  EXTRACTION  2013  ? CATARACT EXTRACTION  6/15  ? EYE SURGERY Right 03/30/2017  ? right eye lid BX  ? FACIAL COSMETIC SURGERY  2005  ? GREAT TOE ARTHRODESIS, INTERPHALANGEAL JOINT  2007  ? rt  ? HYSTEROSCOPY WITH RESECTOSCOPE  6/09  ?    ? JOINT REPLACEMENT Right 07/29/2016  ? right thumb.  Dr. Amedeo Plenty.  ? MOHS SURGERY  09/2016  ? Dr. Sarajane Jews  ? TONSILLECTOMY  12/07  ? T&A  ? ? ?Current Medications: ?No outpatient medications have been marked as taking for the 03/24/22 encounter (Appointment) with Freada Bergeron, MD.  ?  ? ?Allergies:   Patient has no known allergies.  ? ?Social History  ? ?Socioeconomic History  ? Marital status: Married  ?  Spouse name: Not on file  ? Number of children: Not on file  ? Years of education: Not on file  ? Highest education level: Not on file  ?Occupational History  ? Not on file  ?Tobacco Use  ? Smoking status: Former  ?  Types: Cigarettes  ? Smokeless tobacco: Never  ?Vaping Use  ? Vaping Use: Never used  ?Substance and Sexual Activity  ? Alcohol use: Yes  ?  Alcohol/week: 14.0 standard drinks  ?  Types: 14 Standard drinks or equivalent per week  ? Drug use: No  ?  Sexual activity: Not Currently  ?  Partners: Male  ?  Birth control/protection: Post-menopausal  ?Other Topics Concern  ? Not on file  ?Social History Narrative  ? Not on file  ? ?Social Determinants of Health  ? ?Financial Resource Strain: Not on file  ?Food Insecurity: Not on file  ?Transportation Needs: Not on file  ?Physical Activity: Not on file  ?Stress: Not on file  ?Social Connections: Not on file  ?  ? ?Family History: ?The patient's family history includes Asthma in her daughter; Brain cancer in her maternal grandmother; Diabetes in her brother and paternal aunt; Heart attack in her father; Heart disease in her brother; Hypertension in her mother; Kidney Stones in her father; Kidney disease in her mother. There is no history of Colon cancer, Esophageal cancer, Pancreatic cancer, or Stomach cancer. ? ?ROS:    ?Please see the history of present illness.    ?Review of Systems  ?Constitutional:  Negative for chills and fever.  ?HENT:  Negative for hearing loss.   ?Eyes:  Negative for blurred vision and redness.  ?Respiratory:  Negative for shortness of breath.   ?Cardiovascular:  Positive for palpitations. Negative for chest pain, orthopnea, claudication, leg swelling and PND.  ?Gastrointestinal:  Positive for constipation. Negative for heartburn and nausea.  ?Genitourinary:  Negative for flank pain.  ?Musculoskeletal:  Negative for falls.  ?Neurological:  Negative for dizziness and loss of consciousness.  ?Endo/Heme/Allergies:  Negative for polydipsia.  ?Psychiatric/Behavioral:  Negative for substance abuse.   ? ?EKGs/Labs/Other Studies Reviewed:   ? ?The following studies were reviewed today: ?TTE from [place] 04/11/22L LVEF 65%, myxomatous MV with prolapse and mild-mod MR, mild TR, mild PAH, mild RV enlargement with preserved RV function. ? ?Ca score 04/2021: ?FINDINGS: ?Coronary arteries: Normal origins. ?  ?Coronary Calcium Score: ?  ?Left main: 0 ?  ?Left anterior descending artery: 5.91 ?  ?Left circumflex artery: 9.29 ?  ?Right coronary artery: 0 ?  ?Total: 15 ?  ?Percentile: 48th ?  ?Pericardium: Normal.  Trace pericardial fluid. ?  ?Ascending Aorta: Normal caliber. Ascending aorta measures ?approximately 44m at the mid ascending aorta measured in an axial ?plane. ?  ?[facility]cardiac: [facility] ?  ?IMPRESSION: ?Coronary calcium score of 15. This was 48th percentile for age-, ?race-, and sex-matched controls. ? ?EKG:  EKG is ordered today.  The ekg ordered today demonstrates NSR with rsr' with mild right sided conduction delay, HR 81 ? ?Recent Labs: ?03/19/2021: TSH 1.456  ?Recent Lipid Panel ?   ?Component Value Date/Time  ? CHOL 145 12/31/2021 0944  ? TRIG 64 12/31/2021 0944  ? HDL 57 12/31/2021 0944  ? CHOLHDL 2.5 12/31/2021 0944  ? CHOLHDL 2.6 09/12/2014 1542  ? VLDL 19  09/12/2014 1542  ? [place]75 12/31/2021 0944  ? ? ? ?Risk Assessment/Calculations:   ?  ? ? ?Physical Exam:   ? ?VS:  LMP 02/23/2008 (Approximate)    ? ?Wt Readings from Last 3 Encounters:  ?10/24/21 106 lb 6.6 oz (48.3 kg)  ?04/02/21 107 lb 3.2 oz (48.6 kg)  ?04/02/21 106 lb 4 oz (48.2 kg)  ?  ? ?GEN:  Well nourished, well developed in no acute distress ?HEENT: Normal ?NECK: No JVD; No carotid bruits ?CARDIAC: RRR, no murmurs, rubs, gallops ?RESPIRATORY:  Clear to auscultation without rales, wheezing or rhonchi  ?ABDOMEN: Soft, non-tender, non-distended ?MUSCULOSKELETAL:  No edema; No deformity  ?SKIN: Warm and dry ?NEUROLOGIC:  Alert and oriented x 3 ?PSYCHIATRIC:  Normal affect  ? ?ASSESSMENT:   ? ?No diagnosis found. ? ?PLAN:   ? ?In order of problems listed above: ? ?#Paroxysmal atrial tachycardia: ?Diagnosed by Cardiologist in [place] about 8 years ago. Was previously on metop but symptoms resolved and metop was stopped. Had recurrent palpitations in 02/2021 where she presented to [place] ED where she was diagnosed with PACs. She was restarted on metop tartrate 12.'5mg'$  daily with resolution of symptoms. TTE with LVEF 65%, myxomatous MV with mild prolapse, mild-to-moderate MR, mild TR, mild RV enlargement with preserved RV function. ?-Continue metop 12.'5mg'$  BID (takes only dose at night); can increase to BID if needed ? 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 Patient asymptomatic and doing well. ?-Repeat TTE in 2024 ? ?#HLD: ?#Coronary Ca on CT: ?#Family History of Premature CAD: ?Ca score 15 (48%).  ?-Continue crestor '5mg'$  daily ? ?Medication Adjustments/Labs and Tests Ordered: ?Current medicines are reviewed at length with the patient today.  Concerns regarding medicines are outlined above.  ?No orders of the defined types were  placed in this encounter. ? ?No orders of the defined types were placed in this encounter. ? ? ?There are no Patient Instructions on file for this visit. ?  ? ?Signed, ?Freada Bergeron, MD  ?03/18/2022 1:04 PM    ?[place]

## 2022-03-24 ENCOUNTER — Encounter: Payer: Self-pay | Admitting: Cardiology

## 2022-03-24 ENCOUNTER — Ambulatory Visit (INDEPENDENT_AMBULATORY_CARE_PROVIDER_SITE_OTHER): Payer: Medicare Other | Admitting: Cardiology

## 2022-03-24 VITALS — BP 132/78 | HR 71 | Ht 61.5 in | Wt 108.2 lb

## 2022-03-24 DIAGNOSIS — I341 Nonrheumatic mitral (valve) prolapse: Secondary | ICD-10-CM | POA: Diagnosis not present

## 2022-03-24 DIAGNOSIS — Z8249 Family history of ischemic heart disease and other diseases of the circulatory system: Secondary | ICD-10-CM

## 2022-03-24 DIAGNOSIS — I2510 Atherosclerotic heart disease of native coronary artery without angina pectoris: Secondary | ICD-10-CM | POA: Diagnosis not present

## 2022-03-24 DIAGNOSIS — I340 Nonrheumatic mitral (valve) insufficiency: Secondary | ICD-10-CM | POA: Diagnosis not present

## 2022-03-24 DIAGNOSIS — E785 Hyperlipidemia, unspecified: Secondary | ICD-10-CM | POA: Diagnosis not present

## 2022-03-24 DIAGNOSIS — I471 Supraventricular tachycardia: Secondary | ICD-10-CM | POA: Diagnosis not present

## 2022-03-24 NOTE — Patient Instructions (Signed)
Medication Instructions:   Your physician recommends that you continue on your current medications as directed. Please refer to the Current Medication list given to you today.  *If you need a refill on your cardiac medications before your next appointment, please call your pharmacy*   Testing/Procedures:  Your physician has requested that you have an echocardiogram. Echocardiography is a painless test that uses sound waves to create images of your heart. It provides your doctor with information about the size and shape of your heart and how well your heart's chambers and valves are working. This procedure takes approximately one hour. There are no restrictions for this procedure.   Follow-Up: At [facility], you and your health needs are our priority.  As part of our continuing mission to provide you with exceptional heart care, we have created designated Provider Care Teams.  These Care Teams include your primary Cardiologist (physician) and Advanced Practice Providers (APPs -  Physician Assistants and Nurse Practitioners) who all work together to provide you with the care you need, when you need it.  We recommend signing up for the patient portal called "MyChart".  Sign up information is provided on this After Visit Summary.  MyChart is used to connect with patients for Virtual Visits (Telemedicine).  Patients are able to view lab/test results, encounter notes, upcoming appointments, etc.  Non-urgent messages can be sent to your provider as well.   To learn more about what you can do with MyChart, go to [email or web address].    Your next appointment:   1 year(s)  The format for your next appointment:   In Person  Provider:   DR. PEMBERTON   Important Information About Sugar       

## 2022-03-24 NOTE — Progress Notes (Signed)
?Cardiology Office Note:   ? ?Date:  03/24/2022  ? ?ID:  COURNEY Robinson, DOB [birth year], MRN [id number] ? ?PCP:  Lajean Manes, MD ?  ?[facility] Providers ?Cardiologist:  None { ? ? ?Referring MD: Lajean Manes, MD  ? ? ?History of Present Illness:   ? ?Lindsey Robinson is a 71 y.o. female with a hx of paroxysmal atrial tachycardia and HTN who presents to clinic for follow-up. ? ?The patient has a known history paroxysmal atrial tachycardia that was diagnosed about 8 years ago. She was prescribed metoprolol at that time but never took it as symptoms resolved. Had recurrent episode of palpitations in 02/2021 where she presented to the ER in [place]. She was diagnosed with elevated blood pressure and PACs at that time. 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 She was placed on metoprolol with resolution of symptoms and control of her blood pressures.  ? ?Was last seen in clinic on 03/2021 where she was doing much better on metoprolol. Ca score 04/2021 15 (48%). Was started on crestor '5mg'$  at that time. ? ?Today, the patient states that she is feeling great. Every once in a while she feels a minor palpitation. She states she only needs to take 1/2 a tablet of metoprolol at night for adequate control of her palpitations. ? ?In 10/2021 while decorating a christmas tree, she felt like she was getting dirt on her hands. Two days later while driving to [place], she noticed her hands were discolored and appeared gray. She denies feeling any coldness in her hands. This has not occurred in her legs. No known Raynauds disease. No associated pain in the hands. Symptoms have since resolved.  ? ?She denies any chest pain, shortness of breath, or peripheral edema. No lightheadedness, headaches, syncope, orthopnea, or PND. ? ?Family history: Mother with Afib, aortic stenosis, ESRD. Father with history of CAD and MI. Brother with  CAD s/p CABG in 77s.   ? ?Past Medical History:  ?Diagnosis Date  ? Abnormal Pap smear of cervix   ? Arthritis   ? BCC (basal cell carcinoma of skin)   ? on lip. Had Mohs surgery.  ? Carpal tunnel syndrome   ? Cataract   ? right eye 04/2014  ? Fibroids   ? HSV-2 infection   ? Hypertension   ? Palpitations   ? PAT (paroxysmal atrial tachycardia) ([place]) 9/14  ? Trigger finger   ? Urge incontinence of urine   ? ? ?Past Surgical History:  ?Procedure Laterality Date  ? [place]  ? CARPAL TUNNEL RELEASE  10/26/2012  ? Procedure: CARPAL TUNNEL RELEASE;  Surgeon: Cammie Sickle., MD;  Location: [place];  Service: Orthopedics;  Laterality: Left;  ? CATARACT EXTRACTION  2013  ? CATARACT EXTRACTION  6/15  ? EYE SURGERY Right 03/30/2017  ? right eye lid BX  ? FACIAL COSMETIC SURGERY  2005  ? GREAT TOE ARTHRODESIS, INTERPHALANGEAL JOINT  2007  ? rt  ? HYSTEROSCOPY WITH RESECTOSCOPE  6/09  ?    ? JOINT REPLACEMENT Right 07/29/2016  ? right thumb.  Dr. Amedeo Plenty.  ? MOHS SURGERY  09/2016  ? Dr. Sarajane Jews  ? TONSILLECTOMY  12/07  ? T&A  ? ? ?Current Medications: ?Current Meds  ?Medication Sig  ? clobetasol cream (TEMOVATE) 2.35 % Apply 1 application topically as needed.   ? doxycycline (PERIOSTAT) 20  MG tablet Take 20 mg by mouth every other day.  ? fluocinonide (LIDEX) 0.05 % external solution as needed.  ? metoprolol tartrate (LOPRESSOR) 25 MG tablet Take 0.5 tablets (12.5 mg total) by mouth 2 (two) times daily.  ? rosuvastatin (CRESTOR) 5 MG tablet Take 1 tablet (5 mg total) by mouth daily.  ? triamcinolone cream (KENALOG) 0.1 % as needed.  ? valACYclovir (VALTREX) 500 MG tablet TAKE ONE TABLET EACH DAY  ? Vitamin D, Ergocalciferol, (DRISDOL) 1.25 MG (50000 UNIT) CAPS capsule Take 50,000 Units by mouth every 14 (fourteen) days.  ? XIIDRA 5 % SOLN INSTILL ONE DROP IN [facility] EYE EVERY 12 HOURS  ? zaleplon (SONATA) 5 MG capsule Take 5 mg by mouth at bedtime as needed.  ?  ? ?Allergies:   Patient  has no known allergies.  ? ?Social History  ? ?Socioeconomic History  ? Marital status: Married  ?  Spouse name: Not on file  ? Number of children: Not on file  ? Years of education: Not on file  ? Highest education level: Not on file  ?Occupational History  ? Not on file  ?Tobacco Use  ? Smoking status: Former  ?  Types: Cigarettes  ? Smokeless tobacco: Never  ?Vaping Use  ? Vaping Use: Never used  ?Substance and Sexual Activity  ? Alcohol use: Yes  ?  Alcohol/week: 14.0 standard drinks  ?  Types: 14 Standard drinks or equivalent per week  ? Drug use: No  ? Sexual activity: Not Currently  ?  Partners: Male  ?  Birth control/protection: Post-menopausal  ?Other Topics Concern  ? Not on file  ?Social History Narrative  ? Not on file  ? ?Social Determinants of Health  ? ?Financial Resource Strain: Not on file  ?Food Insecurity: Not on file  ?Transportation Needs: Not on file  ?Physical Activity: Not on file  ?Stress: Not on file  ?Social Connections: Not on file  ?  ? ?Family History: ?The patient's family history includes Asthma in her daughter; Brain cancer in her maternal grandmother; Diabetes in her brother and paternal aunt; Heart attack in her father; Heart disease in her brother; Hypertension in her mother; Kidney Stones in her father; Kidney disease in her mother. There is no history of Colon cancer, Esophageal cancer, Pancreatic cancer, or Stomach cancer. ? ?ROS:   ?Please see the history of present illness.    ?Review of Systems  ?Constitutional:  Negative for chills and fever.  ?HENT:  Negative for hearing loss.   ?Eyes:  Negative for blurred vision and redness.  ?Respiratory:  Negative for shortness of breath.   ?Cardiovascular:  Positive for palpitations. Negative for chest pain, orthopnea, claudication, leg swelling and PND.  ?Gastrointestinal:  Negative for heartburn and nausea.  ?Genitourinary:  Negative for flank pain.  ?Musculoskeletal:  Negative for falls.  ?Neurological:  Negative for dizziness and  loss of consciousness.  ?Endo/Heme/Allergies:  Negative for polydipsia.  ?Psychiatric/Behavioral:  Negative for substance abuse.   ? ?EKGs/Labs/Other Studies Reviewed:   ? ?The following studies were reviewed today: ? ?Ca score 04/2021: ?FINDINGS: ?Coronary arteries: Normal origins. ?  ?Coronary Calcium Score: ?  ?Left main: 0 ?  ?Left anterior descending artery: 5.91 ?  ?Left circumflex artery: 9.29 ?  ?Right coronary artery: 0 ?  ?Total: 15 ?  ?Percentile: 48th ?  ?Pericardium: Normal.  Trace pericardial fluid. ?  ?Ascending Aorta: Normal caliber. Ascending aorta measures ?approximately 93m at the mid ascending aorta measured in an axial ?  plane. ?  ?Non-cardiac: See separate report from [facility]. ?  ?IMPRESSION: ?Coronary calcium score of 15. This was 48th percentile for age-, ?race-, and sex-matched controls. ? 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 ? ? ?EKG:  EKG is personally reviewed. ?03/24/2022: Sinus rhythm. Rate 71 bpm. ?04/02/2021: NSR with rsr' with mild right sided conduction delay, HR 81 ? ?Recent Labs: ?No results found for requested labs within last 8760 hours.  ? ?Recent Lipid Panel ?   ?Component Value Date/Time  ? CHOL 145 12/31/2021 0944  ? TRIG 64 12/31/2021 0944  ? HDL 57 12/31/2021 0944  ? CHOLHDL 2.5 12/31/2021 0944  ? CHOLHDL 2.6 09/12/2014 1542  ? VLDL 19 09/12/2014 1542  ? [employer] 75 12/31/2021 0944  ? ? ? ?Risk Assessment/Calculations:   ?  ? ? ?Physical Exam:   ? ?VS:  BP 132/78   Pulse 71   Ht 5' 1.5" (1.562 m)   Wt 108 lb 3.2 oz (49.1 kg)   LMP 02/23/2008 (Approximate)   SpO2 99%   BMI 20.11 kg/m?    ? ?Wt Readings from Last 3 Encounters:  ?03/24/22 108 lb 3.2 oz (49.1 kg)  ?10/24/21 106 lb 6.6 oz (48.3 kg)  ?04/02/21 107 lb 3.2 oz (48.6 kg)  ?  ? ?GEN:  Well nourished, well developed in no acute distress ?HEENT: Normal ?NECK: No JVD; No carotid  bruits ?CARDIAC: RRR, 1/6 systolic murmur, No rubs, no gallops ?RESPIRATORY:  Clear to auscultation without rales, wheezing or rhonchi  ?ABDOMEN: Soft, non-tender, non-distended ?MUSCULOSKELETAL:  No edema; No deformity

## 2022-04-02 DIAGNOSIS — Z1231 Encounter for screening mammogram for malignant neoplasm of breast: Secondary | ICD-10-CM | POA: Diagnosis not present

## 2022-04-03 ENCOUNTER — Encounter (HOSPITAL_BASED_OUTPATIENT_CLINIC_OR_DEPARTMENT_OTHER): Payer: Self-pay | Admitting: *Deleted

## 2022-04-09 ENCOUNTER — Ambulatory Visit (HOSPITAL_COMMUNITY): Payer: Medicare Other | Attending: Cardiology

## 2022-04-09 DIAGNOSIS — I340 Nonrheumatic mitral (valve) insufficiency: Secondary | ICD-10-CM | POA: Diagnosis not present

## 2022-04-09 DIAGNOSIS — I341 Nonrheumatic mitral (valve) prolapse: Secondary | ICD-10-CM | POA: Insufficient documentation

## 2022-04-09 LAB — ECHOCARDIOGRAM COMPLETE
Area-P 1/2: 4.13 cm2
MV M vel: 4.67 m/s
MV Peak grad: 87.4 mmHg
S' Lateral: 2.2 cm

## 2022-04-15 ENCOUNTER — Other Ambulatory Visit: Payer: Self-pay

## 2022-04-15 DIAGNOSIS — Z8249 Family history of ischemic heart disease and other diseases of the circulatory system: Secondary | ICD-10-CM

## 2022-04-15 DIAGNOSIS — L308 Other specified dermatitis: Secondary | ICD-10-CM | POA: Diagnosis not present

## 2022-04-15 DIAGNOSIS — L812 Freckles: Secondary | ICD-10-CM | POA: Diagnosis not present

## 2022-04-15 DIAGNOSIS — D225 Melanocytic nevi of trunk: Secondary | ICD-10-CM | POA: Diagnosis not present

## 2022-04-15 DIAGNOSIS — E785 Hyperlipidemia, unspecified: Secondary | ICD-10-CM

## 2022-04-15 DIAGNOSIS — L718 Other rosacea: Secondary | ICD-10-CM | POA: Diagnosis not present

## 2022-04-15 DIAGNOSIS — Z85828 Personal history of other malignant neoplasm of skin: Secondary | ICD-10-CM | POA: Diagnosis not present

## 2022-04-15 DIAGNOSIS — L218 Other seborrheic dermatitis: Secondary | ICD-10-CM | POA: Diagnosis not present

## 2022-04-15 DIAGNOSIS — Z79899 Other long term (current) drug therapy: Secondary | ICD-10-CM

## 2022-04-15 DIAGNOSIS — L821 Other seborrheic keratosis: Secondary | ICD-10-CM | POA: Diagnosis not present

## 2022-04-15 DIAGNOSIS — I2510 Atherosclerotic heart disease of native coronary artery without angina pectoris: Secondary | ICD-10-CM

## 2022-04-15 DIAGNOSIS — D1801 Hemangioma of skin and subcutaneous tissue: Secondary | ICD-10-CM | POA: Diagnosis not present

## 2022-04-15 DIAGNOSIS — L578 Other skin changes due to chronic exposure to nonionizing radiation: Secondary | ICD-10-CM | POA: Diagnosis not present

## 2022-04-15 DIAGNOSIS — D2262 Melanocytic nevi of left upper limb, including shoulder: Secondary | ICD-10-CM | POA: Diagnosis not present

## 2022-04-15 DIAGNOSIS — D2372 Other benign neoplasm of skin of left lower limb, including hip: Secondary | ICD-10-CM | POA: Diagnosis not present

## 2022-04-15 MED ORDER — ROSUVASTATIN CALCIUM 5 MG PO TABS: 5.0000 mg | ORAL_TABLET | Freq: Every day | ORAL | 3 refills | Status: DC

## 2022-06-19 ENCOUNTER — Other Ambulatory Visit: Payer: Self-pay

## 2022-06-19 DIAGNOSIS — I471 Supraventricular tachycardia: Secondary | ICD-10-CM

## 2022-06-19 DIAGNOSIS — Z8249 Family history of ischemic heart disease and other diseases of the circulatory system: Secondary | ICD-10-CM

## 2022-06-19 DIAGNOSIS — E785 Hyperlipidemia, unspecified: Secondary | ICD-10-CM

## 2022-06-19 MED ORDER — METOPROLOL TARTRATE 25 MG PO TABS: 12.5000 mg | ORAL_TABLET | Freq: Two times a day (BID) | ORAL | 3 refills | Status: DC

## 2022-07-23 DIAGNOSIS — H2513 Age-related nuclear cataract, bilateral: Secondary | ICD-10-CM | POA: Diagnosis not present

## 2022-09-15 DIAGNOSIS — L719 Rosacea, unspecified: Secondary | ICD-10-CM | POA: Diagnosis not present

## 2022-09-15 DIAGNOSIS — H26492 Other secondary cataract, left eye: Secondary | ICD-10-CM | POA: Diagnosis not present

## 2022-09-15 DIAGNOSIS — H26493 Other secondary cataract, bilateral: Secondary | ICD-10-CM | POA: Diagnosis not present

## 2022-09-15 DIAGNOSIS — H04123 Dry eye syndrome of bilateral lacrimal glands: Secondary | ICD-10-CM | POA: Diagnosis not present

## 2022-10-17 IMAGING — US US PELVIS COMPLETE WITH TRANSVAGINAL
1 series · 13 of 25 positions shown · non-contrast
Comparison: None available.

CLINICAL DATA: Initial evaluation for acute abdominal swelling and
bloating.



[Series 1: us pelvic complete with transvaginal · 13 of 47 slices shown]
[im 1/47]
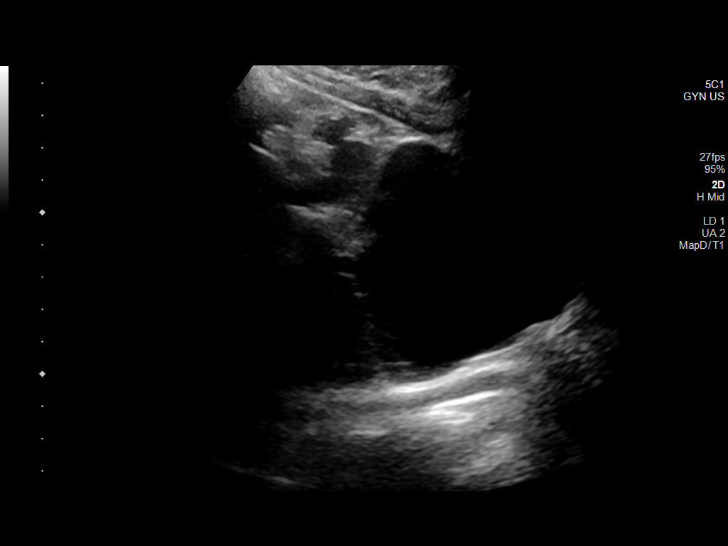
[im 4/47]
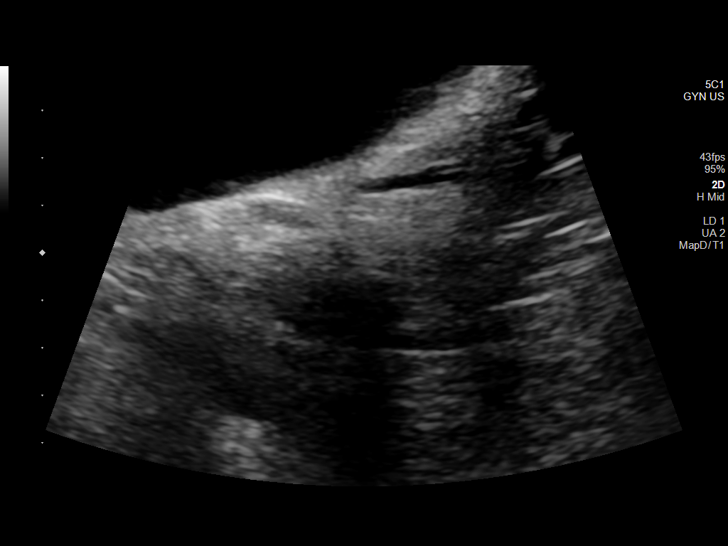
[im 8/47]
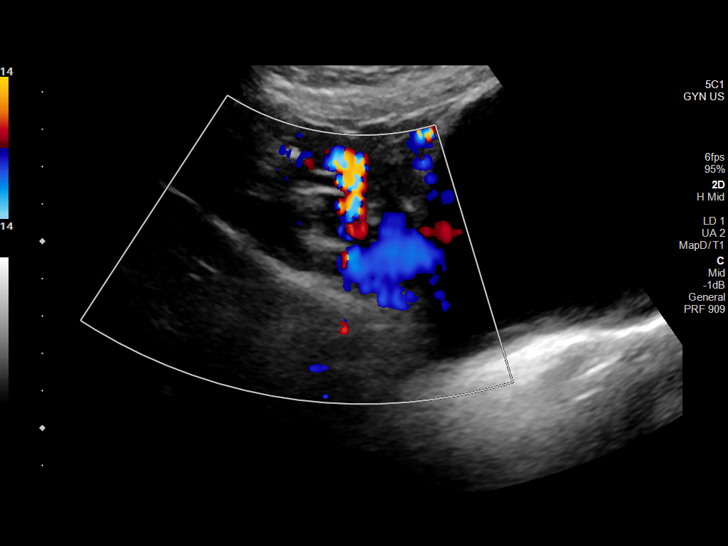
[im 12/47]
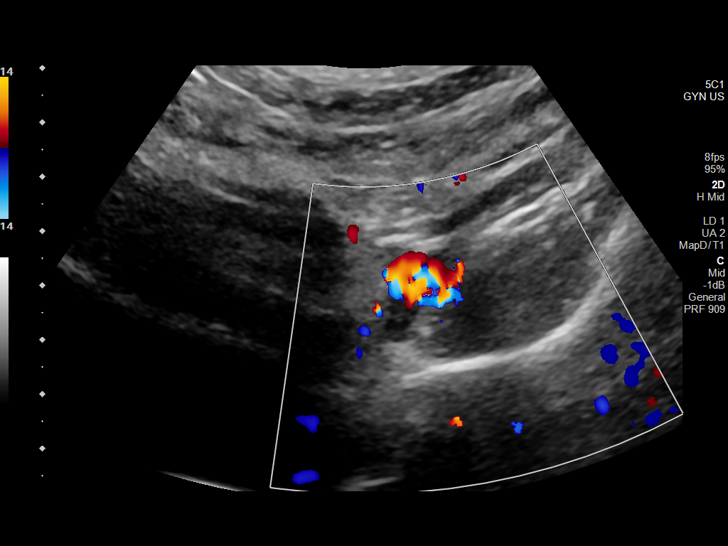
[im 16/47]
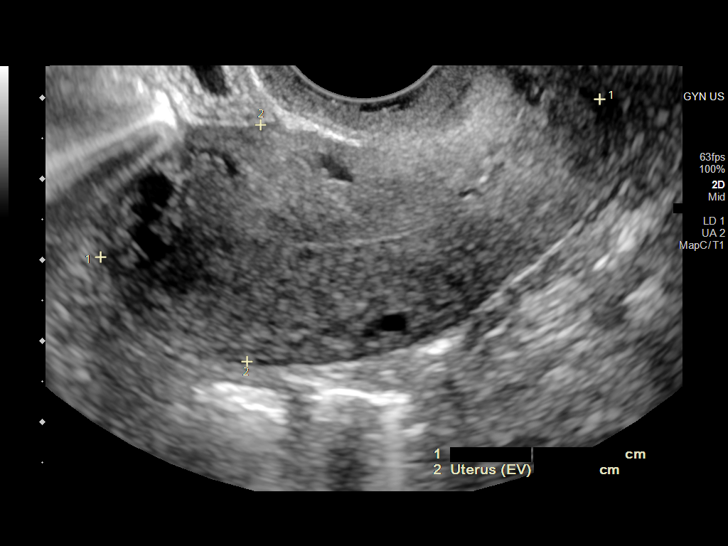
[im 20/47]
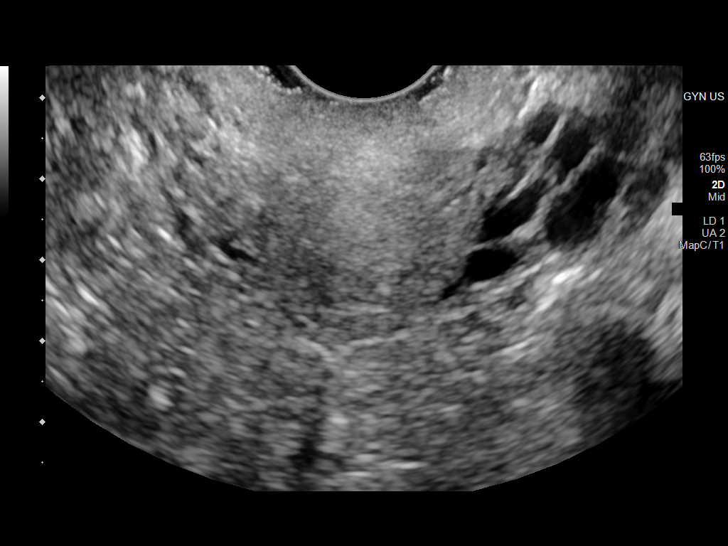
[im 24/47]
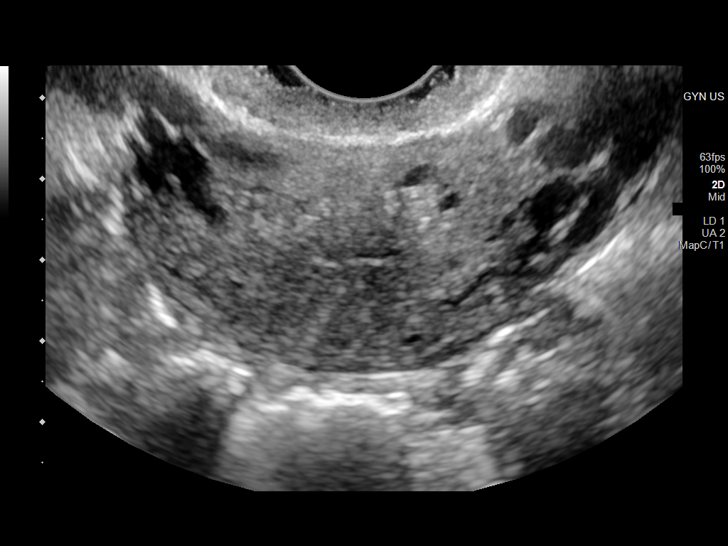
[im 27/47]
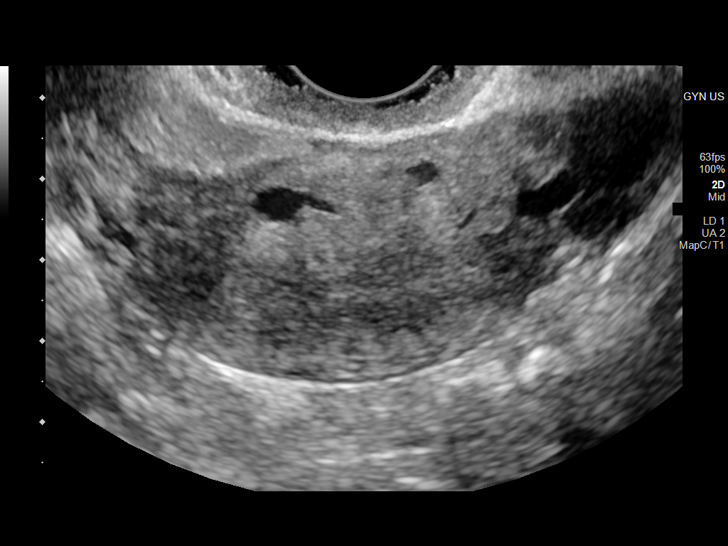
[im 31/47]
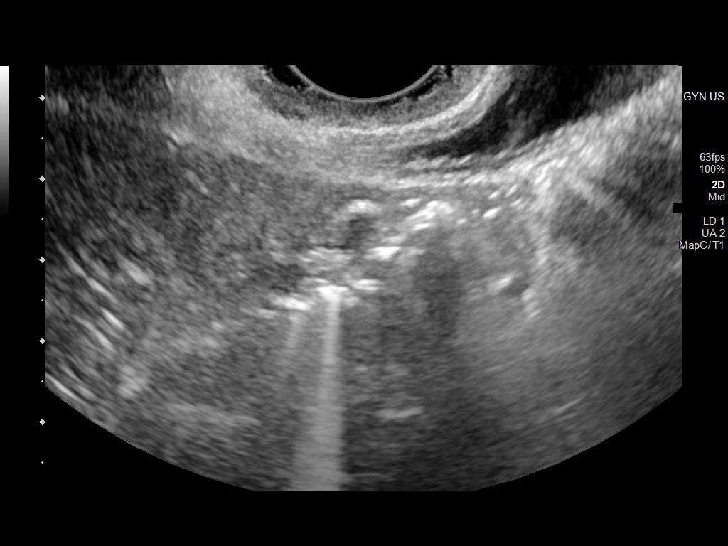
[im 35/47]
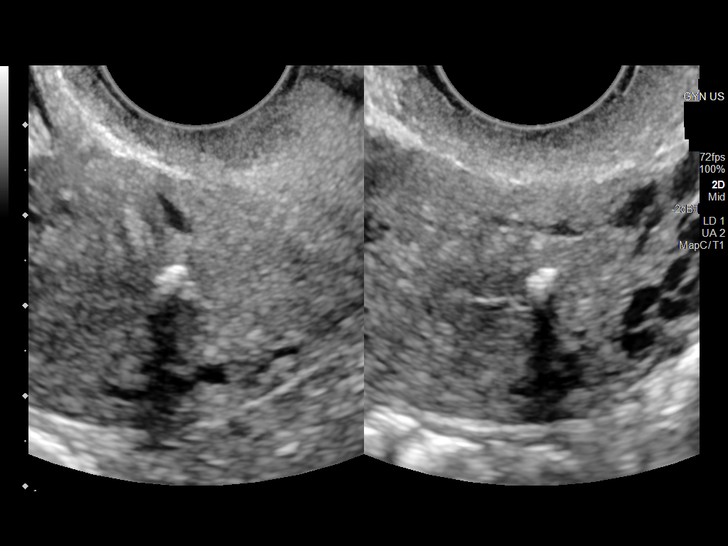
[im 39/47]
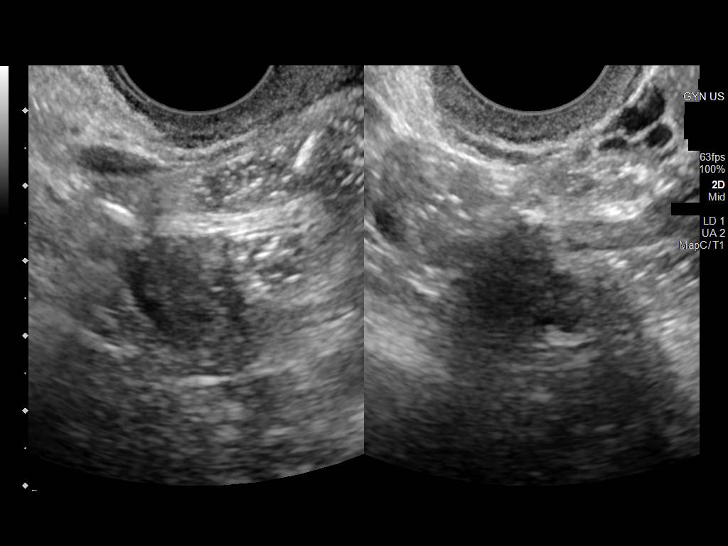
[im 43/47]
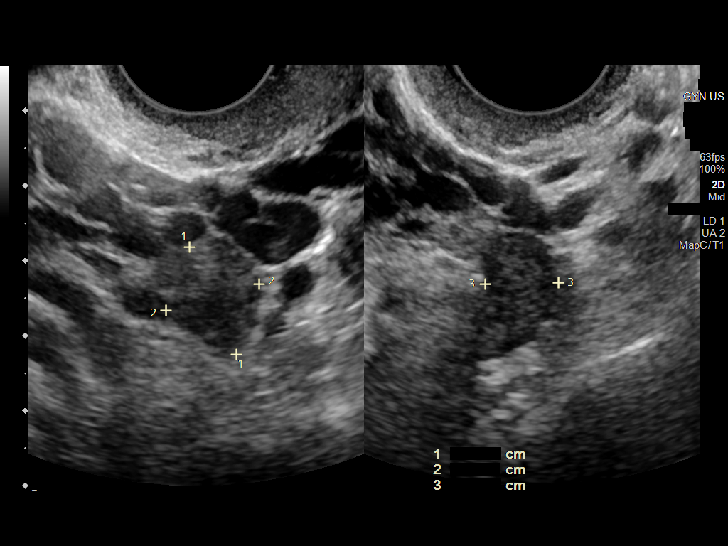
[im 47/47]
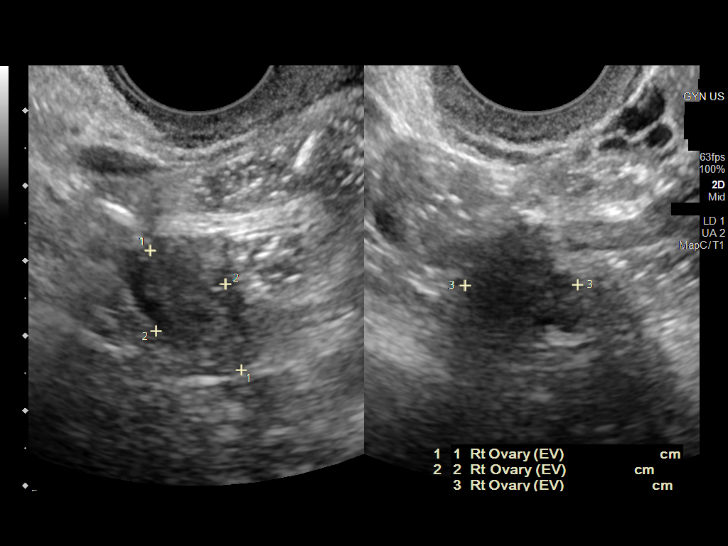

[13 of 25 positions shown; findings below may reference images not displayed]

FINDINGS: Uterus

Measurements: 6.5 x 2.9 x 4.4 cm = volume: 43.6 mL. Uterus is
anteverted. 0.7 x 0.5 x 0.7 cm hypoechoic intramural fibroid present
at the right posterior uterine fundus. 5 mm dystrophic calcification
at the left uterine body suspected to reflect a small calcified
submucosal fibroid (image 24).

Endometrium

Thickness: 1.4 mm.  No focal abnormality visualized.

Right ovary

Measurements: 2.0 x 1.1 x 1.5 cm = volume: 1.8 mL. Normal
appearance/no adnexal mass.

Left ovary

Measurements: 1.6 x 1.3 x 1.0 cm = volume: 1.0 mL. Normal
appearance/no adnexal mass.

Other findings

No abnormal free fluid.
IMPRESSION: 1. Two small fibroids measuring up to 0.7 cm as above.
2. Otherwise unremarkable and normal pelvic ultrasound with normal
sonographic appearance of the ovaries. No pelvic or adnexal mass. No
abnormal free fluid.

## 2022-10-22 DIAGNOSIS — D2272 Melanocytic nevi of left lower limb, including hip: Secondary | ICD-10-CM | POA: Diagnosis not present

## 2022-10-22 DIAGNOSIS — L578 Other skin changes due to chronic exposure to nonionizing radiation: Secondary | ICD-10-CM | POA: Diagnosis not present

## 2022-10-22 DIAGNOSIS — L570 Actinic keratosis: Secondary | ICD-10-CM | POA: Diagnosis not present

## 2022-10-22 DIAGNOSIS — L821 Other seborrheic keratosis: Secondary | ICD-10-CM | POA: Diagnosis not present

## 2022-10-22 DIAGNOSIS — Z85828 Personal history of other malignant neoplasm of skin: Secondary | ICD-10-CM | POA: Diagnosis not present

## 2022-10-22 DIAGNOSIS — D2262 Melanocytic nevi of left upper limb, including shoulder: Secondary | ICD-10-CM | POA: Diagnosis not present

## 2022-10-22 DIAGNOSIS — L308 Other specified dermatitis: Secondary | ICD-10-CM | POA: Diagnosis not present

## 2022-10-22 DIAGNOSIS — L812 Freckles: Secondary | ICD-10-CM | POA: Diagnosis not present

## 2022-10-22 DIAGNOSIS — D2271 Melanocytic nevi of right lower limb, including hip: Secondary | ICD-10-CM | POA: Diagnosis not present

## 2022-10-22 DIAGNOSIS — D225 Melanocytic nevi of trunk: Secondary | ICD-10-CM | POA: Diagnosis not present

## 2022-10-27 DIAGNOSIS — M13842 Other specified arthritis, left hand: Secondary | ICD-10-CM | POA: Diagnosis not present

## 2022-10-27 DIAGNOSIS — R52 Pain, unspecified: Secondary | ICD-10-CM | POA: Diagnosis not present

## 2022-10-27 DIAGNOSIS — M1812 Unilateral primary osteoarthritis of first carpometacarpal joint, left hand: Secondary | ICD-10-CM | POA: Diagnosis not present

## 2022-11-06 DIAGNOSIS — F5101 Primary insomnia: Secondary | ICD-10-CM | POA: Diagnosis not present

## 2022-11-06 DIAGNOSIS — Z23 Encounter for immunization: Secondary | ICD-10-CM | POA: Diagnosis not present

## 2022-11-06 DIAGNOSIS — Z79899 Other long term (current) drug therapy: Secondary | ICD-10-CM | POA: Diagnosis not present

## 2022-11-06 DIAGNOSIS — Z0000 Encounter for general adult medical examination without abnormal findings: Secondary | ICD-10-CM | POA: Diagnosis not present

## 2022-11-06 DIAGNOSIS — I4719 Other supraventricular tachycardia: Secondary | ICD-10-CM | POA: Diagnosis not present

## 2022-11-06 DIAGNOSIS — E7800 Pure hypercholesterolemia, unspecified: Secondary | ICD-10-CM | POA: Diagnosis not present

## 2022-11-06 DIAGNOSIS — M8589 Other specified disorders of bone density and structure, multiple sites: Secondary | ICD-10-CM | POA: Diagnosis not present

## 2022-11-20 ENCOUNTER — Other Ambulatory Visit (HOSPITAL_BASED_OUTPATIENT_CLINIC_OR_DEPARTMENT_OTHER): Payer: Self-pay | Admitting: *Deleted

## 2022-11-20 ENCOUNTER — Telehealth (HOSPITAL_BASED_OUTPATIENT_CLINIC_OR_DEPARTMENT_OTHER): Payer: Self-pay | Admitting: Obstetrics & Gynecology

## 2022-11-20 DIAGNOSIS — B009 Herpesviral infection, unspecified: Secondary | ICD-10-CM

## 2022-11-20 MED ORDER — VALACYCLOVIR HCL 500 MG PO TABS: ORAL_TABLET | ORAL | 0 refills | Status: DC

## 2022-11-20 NOTE — Telephone Encounter (Signed)
Patient called and left a message for a refill request .I called patient back to set up appointment .

## 2022-11-20 NOTE — Telephone Encounter (Signed)
Pt requests refill on Valtrex. Has appt scheduled in February. Rx sent to pharmacy.

## 2022-11-20 NOTE — Progress Notes (Signed)
Pt requests refill on Valtrex. Rx sent to pharmacy.

## 2022-12-02 DIAGNOSIS — H109 Unspecified conjunctivitis: Secondary | ICD-10-CM | POA: Diagnosis not present

## 2022-12-03 IMAGING — CT CT CARDIAC CORONARY ARTERY CALCIUM SCORE
3 series · 14 of 20 positions shown, 15 images · non-contrast
Comparison: None.
COMPARISON: None.

Addendum:
EXAM:
OVER-READ INTERPRETATION  CT CHEST

The following report is an over-read performed by radiologist Dr.
Florentin Southwick [[email or web address]] on 05/06/2021. This
over-read does not include interpretation of cardiac or coronary
anatomy or pathology. The coronary calcium score interpretation by
the cardiologist is attached.
CLINICAL DATA: Cardiovascular disease risk stratification
CT Coronary Calcium Score
TECHNIQUE: A gated, non-contrast computed tomography scan of the heart was
performed using 3mm slice thickness. Axial images were analyzed on a
dedicated workstation. Calcium scoring of the coronary arteries was
performed using the Agatston method.

[Series 2: casc 3.0 bv41 2 bestdiast 75 % · axial · 0.38mm/px · z∈[-407,-317]mm · 4 of 50 slices shown, 5 images]
[im 10/50  vessel]
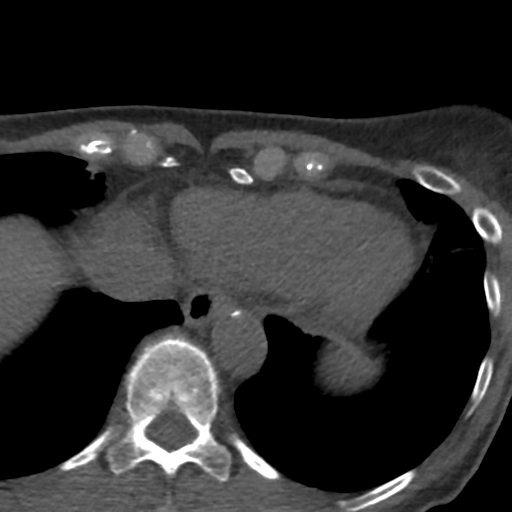
[im 10/50  lung]
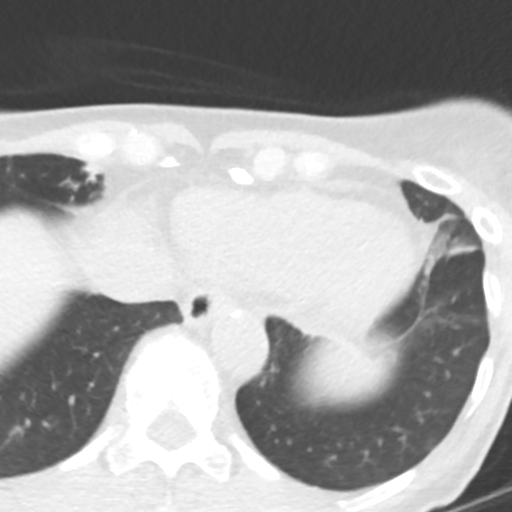
[im 20/50  vessel]
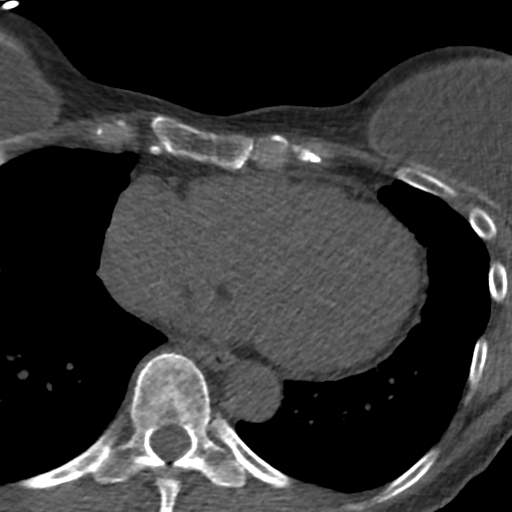
[im 30/50  vessel]
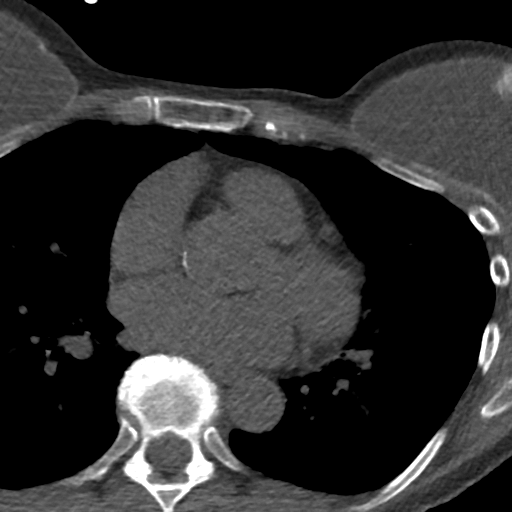
[im 40/50  vessel]
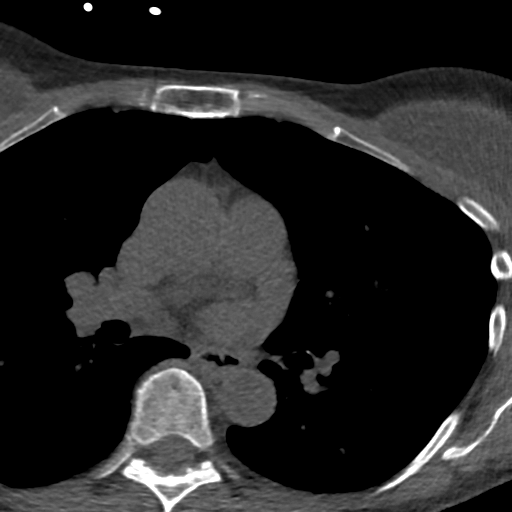

[Series 3: lung 75 % · axial · 0.62mm/px · z∈[-410,-314]mm · 5 of 50 slices shown]
[im 9/50  lung]
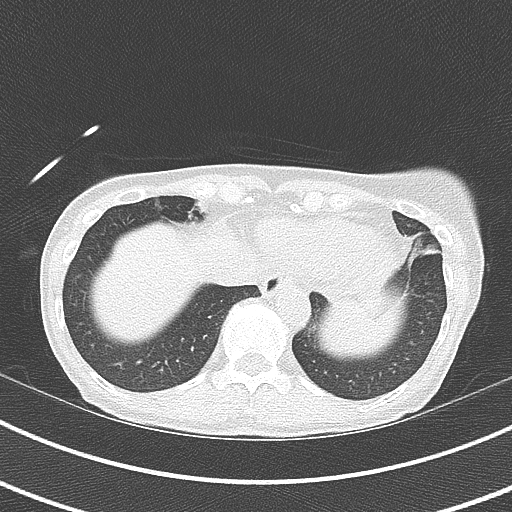
[im 17/50  lung]
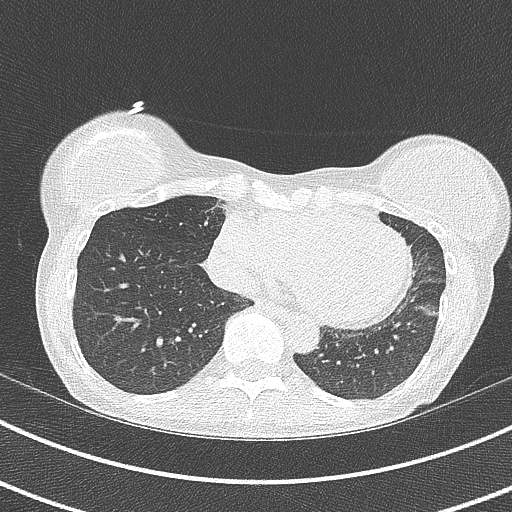
[im 25/50  lung]
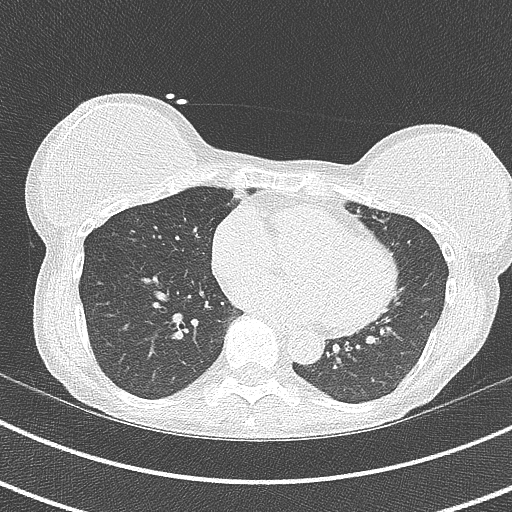
[im 33/50  lung]
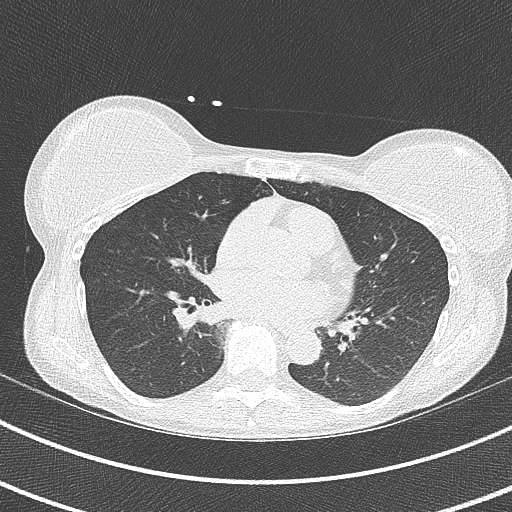
[im 41/50  lung]
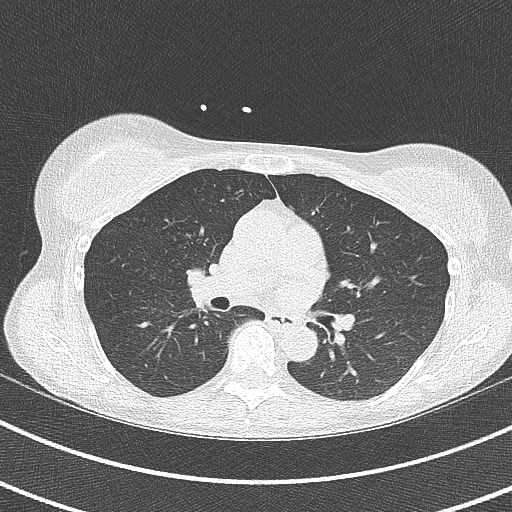

[Series 4: lung st 75 % · axial · 0.62mm/px · z∈[-410,-314]mm · 5 of 50 slices shown]
[im 9/50  lung]
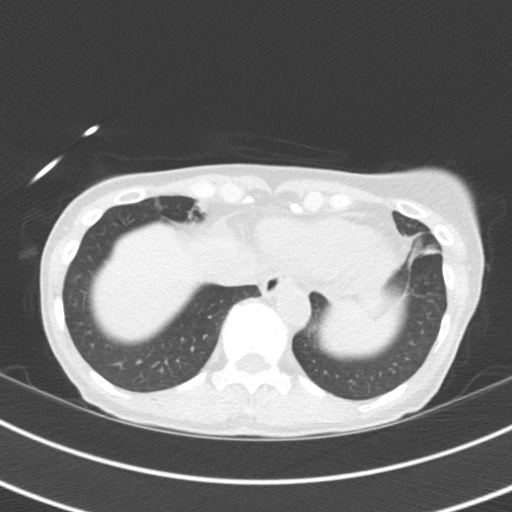
[im 17/50  lung]
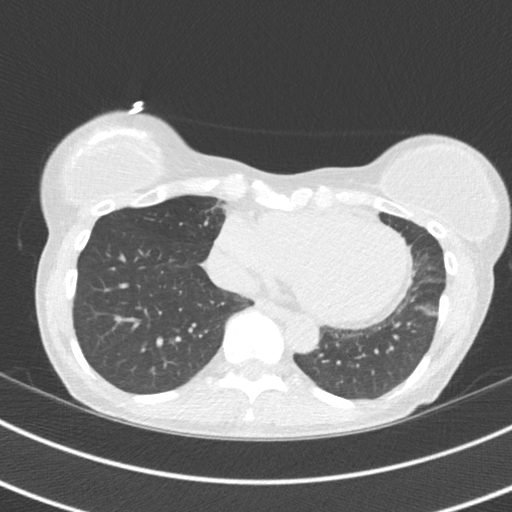
[im 25/50  lung]
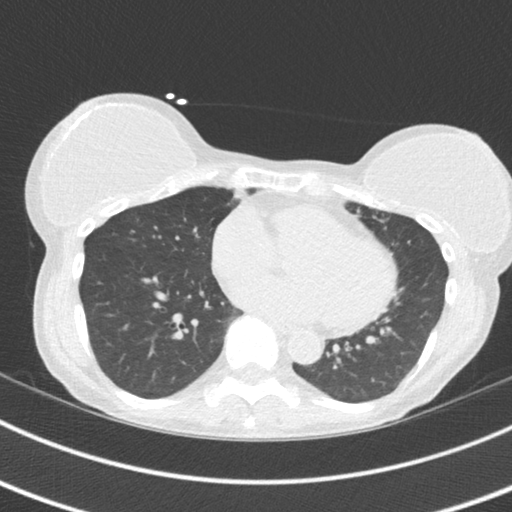
[im 33/50  lung]
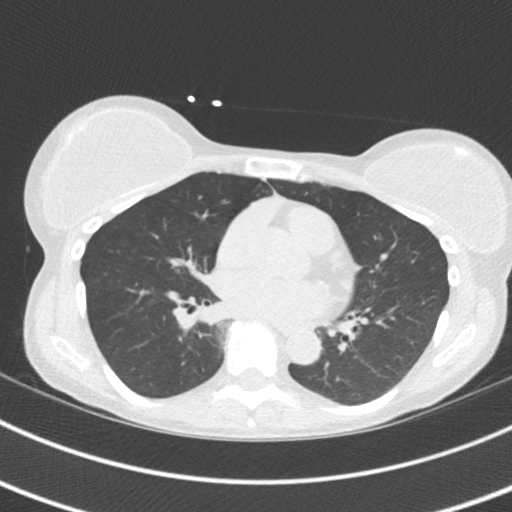
[im 41/50  lung]
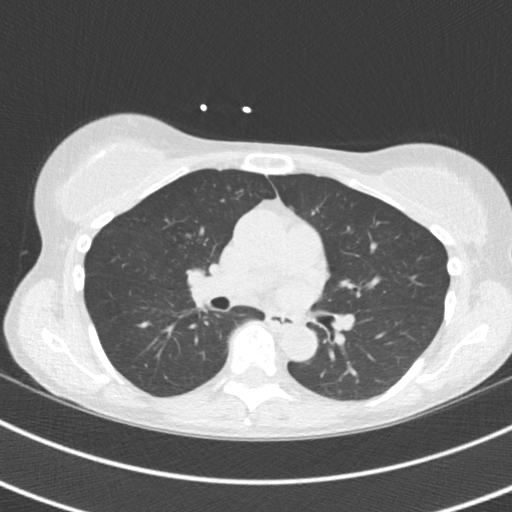

[14 of 20 positions shown; findings below may reference images not displayed]

FINDINGS: Atherosclerotic calcifications in the thoracic aorta. A few
scattered 1-2 mm pulmonary nodules are noted in the periphery of the
lungs, nonspecific, but likely to represent areas of mucoid
impaction within terminal bronchioles. Within the visualized
portions of the thorax there are no other larger more suspicious
appearing pulmonary nodules or masses, there is no acute
consolidative airspace disease, no pleural effusions, no
pneumothorax and no lymphadenopathy. Visualized portions of the
upper abdomen are unremarkable. There are no aggressive appearing
lytic or blastic lesions noted in the visualized portions of the
skeleton. Bilateral breast implants are incidentally noted.
IMPRESSION: 1. Tiny 1-2 mm pulmonary nodules in the periphery of the lungs,
favored to be areas of benign areas of mucoid impaction.
Technically, these are nonspecific. No follow-up needed if patient
is low-risk (and has no known or suspected primary neoplasm).
Non-contrast chest CT can be considered in 12 months if patient is
high-risk. This recommendation follows the consensus statement:
Guidelines for Management of Incidental Pulmonary Nodules Detected
2.  Aortic Atherosclerosis (4B0T2-AAR.R).
FINDINGS: Coronary arteries: Normal origins.

Coronary Calcium Score:

Left main: 0

Left anterior descending artery:

Left circumflex artery:

Right coronary artery: 0

Total: 15

Percentile: 48th

Pericardium: Normal.  Trace pericardial fluid.

Ascending Aorta: Normal caliber. Ascending aorta measures
approximately 31mm at the mid ascending aorta measured in an axial
plane.

Non-cardiac: See separate report from [REDACTED].
IMPRESSION: Coronary calcium score of 15. This was 48th percentile for age-,
race-, and sex-matched controls.



If CAC=0, it is reasonable to withhold statin therapy and reassess
in 5 to 10 years, as long as higher risk conditions are absent
(diabetes mellitus, family history of premature CHD in first degree
relatives (males <55 years; females <65 years), cigarette smoking,
or LDL >=190 mg/dL).

If CAC is 1 to 99, it is reasonable to initiate statin therapy for
patients >=55 years of age.

If CAC is >=100 or >=75th percentile, it is reasonable to initiate
statin therapy at any age.

Cardiology referral should be considered for patients with CAC
scores >=400 or >=75th percentile.

*2469 AHA/ACC/AACVPR/AAPA/ABC/FDZ/PURCACHI/SEDUARDOZ/[place]/TORRIANN/MIA/SAHIR
Guideline on the Management of Blood Cholesterol: A Report of the
American College of Cardiology/American Heart Association Task Force
on Clinical Practice Guidelines. J Am Coll Cardiol.
6369;73(24):5528-5089.

*** End of Addendum ***
EXAM:
OVER-READ INTERPRETATION  CT CHEST

The following report is an over-read performed by radiologist Dr.
Florentin Southwick [[email or web address]] on 05/06/2021. This
over-read does not include interpretation of cardiac or coronary
anatomy or pathology. The coronary calcium score interpretation by
the cardiologist is attached.
FINDINGS: Atherosclerotic calcifications in the thoracic aorta. A few
scattered 1-2 mm pulmonary nodules are noted in the periphery of the
lungs, nonspecific, but likely to represent areas of mucoid
impaction within terminal bronchioles. Within the visualized
portions of the thorax there are no other larger more suspicious
appearing pulmonary nodules or masses, there is no acute
consolidative airspace disease, no pleural effusions, no
pneumothorax and no lymphadenopathy. Visualized portions of the
upper abdomen are unremarkable. There are no aggressive appearing
lytic or blastic lesions noted in the visualized portions of the
skeleton. Bilateral breast implants are incidentally noted.
IMPRESSION: 1. Tiny 1-2 mm pulmonary nodules in the periphery of the lungs,
favored to be areas of benign areas of mucoid impaction.
Technically, these are nonspecific. No follow-up needed if patient
is low-risk (and has no known or suspected primary neoplasm).
Non-contrast chest CT can be considered in 12 months if patient is
high-risk. This recommendation follows the consensus statement:
Guidelines for Management of Incidental Pulmonary Nodules Detected
2.  Aortic Atherosclerosis (4B0T2-AAR.R).

## 2022-12-26 DIAGNOSIS — H1045 Other chronic allergic conjunctivitis: Secondary | ICD-10-CM | POA: Diagnosis not present

## 2022-12-29 ENCOUNTER — Encounter (HOSPITAL_BASED_OUTPATIENT_CLINIC_OR_DEPARTMENT_OTHER): Payer: Self-pay | Admitting: Obstetrics & Gynecology

## 2022-12-29 ENCOUNTER — Ambulatory Visit (INDEPENDENT_AMBULATORY_CARE_PROVIDER_SITE_OTHER): Payer: Medicare Other | Admitting: Obstetrics & Gynecology

## 2022-12-29 VITALS — BP 126/80 | HR 72 | Ht 62.0 in | Wt 106.4 lb

## 2022-12-29 DIAGNOSIS — Z780 Asymptomatic menopausal state: Secondary | ICD-10-CM

## 2022-12-29 DIAGNOSIS — D171 Benign lipomatous neoplasm of skin and subcutaneous tissue of trunk: Secondary | ICD-10-CM

## 2022-12-29 DIAGNOSIS — M8580 Other specified disorders of bone density and structure, unspecified site: Secondary | ICD-10-CM | POA: Diagnosis not present

## 2022-12-29 DIAGNOSIS — B009 Herpesviral infection, unspecified: Secondary | ICD-10-CM

## 2022-12-29 DIAGNOSIS — Z9189 Other specified personal risk factors, not elsewhere classified: Secondary | ICD-10-CM

## 2022-12-29 MED ORDER — VALACYCLOVIR HCL 500 MG PO TABS: ORAL_TABLET | ORAL | 4 refills | Status: DC

## 2022-12-29 NOTE — Progress Notes (Signed)
72 y.o. G51P0002 Married White or Caucasian female here for breast and pelvic exam.  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  On valtrex daily.  Needs refill.    Has two grandsons born five hours apart, one in [place] and one in [place].  Their 1 year birthdays are tomorrow.    Denies vaginal bleeding.  Was having some lower discomfort last year.  CT showed stool burden.  She has cut back on dairy and this really helped.    Patient's last menstrual period was 02/23/2008 (approximate).          Sexually active: No.   Health Maintenance: PCP:  Sadie Haber, new provider, Dr. Louis Matte.  Last wellness appt was 10/2022.  Did blood work at that appt: yes Vaccines are up to date:  pt not interested in any more covid vaccines Colonoscopy:  03/16/2015 MMG:  04/02/2022 Negative BMD:  11/10/2016 Last pap smear:  08/09/2019 Normal.   H/o abnormal pap smear:  no    reports that she has quit smoking. Her smoking use included cigarettes. She has never used smokeless tobacco. She reports current alcohol use of about 14.0 standard drinks of alcohol per week. She reports that she does not use drugs.  Past Medical History:  Diagnosis Date   Abnormal Pap smear of cervix    Arthritis    BCC (basal cell carcinoma of skin)    on lip. Had Mohs surgery.   Carpal tunnel syndrome    Cataract    right eye 04/2014   Fibroids    HSV-2 infection    Hypertension    Palpitations    PAT (paroxysmal atrial tachycardia) 9/14   Trigger finger    Urge incontinence of urine     Past Surgical History:  Procedure Laterality Date   BREAST ENHANCEMENT SURGERY  1973   CARPAL TUNNEL RELEASE  10/26/2012   Procedure: CARPAL TUNNEL RELEASE;  Surgeon: Cammie Sickle., MD;  Location: [facility];  Service: Orthopedics;  Laterality: Left;   CATARACT EXTRACTION  2013   CATARACT EXTRACTION  6/15   EYE SURGERY Right 03/30/2017   right eye lid BX   FACIAL COSMETIC SURGERY  2005   GREAT TOE ARTHRODESIS,  INTERPHALANGEAL JOINT  2007   rt   HYSTEROSCOPY WITH RESECTOSCOPE  6/09       JOINT REPLACEMENT Right 07/29/2016   right thumb.  Dr. Amedeo Plenty.   MOHS SURGERY  09/2016   Dr. Sarajane Jews   TONSILLECTOMY  12/07   T&A    Current Outpatient Medications  Medication Sig Dispense Refill   clobetasol cream (TEMOVATE) 1.54 % Apply 1 application topically as needed.      doxycycline (PERIOSTAT) 20 MG tablet Take 20 mg by mouth every other day.     fluocinonide (LIDEX) 0.05 % external solution as needed.     metoprolol tartrate (LOPRESSOR) 25 MG tablet Take 0.5 tablets (12.5 mg total) by mouth 2 (two) times daily. 90 tablet 3   rosuvastatin (CRESTOR) 5 MG tablet Take 1 tablet (5 mg total) by mouth daily. 90 tablet 3   Vitamin D, Ergocalciferol, (DRISDOL) 1.25 MG (50000 UNIT) CAPS capsule Take 50,000 Units by mouth every 14 (fourteen) days.     XIIDRA 5 % SOLN INSTILL ONE DROP IN EACH EYE EVERY 12 HOURS     zaleplon (SONATA) 5 MG capsule Take 5 mg by mouth at bedtime as needed.     valACYclovir (VALTREX) 500 MG tablet TAKE ONE TABLET EACH DAY  90 tablet 4   No current facility-administered medications for this visit.    Family History  Problem Relation Age of Onset   Hypertension Mother    Kidney disease Mother    Kidney Stones Father    Heart attack Father    Diabetes Paternal Aunt    Brain cancer Maternal Grandmother    Asthma Daughter    Diabetes Brother    Heart disease Brother    Colon cancer Neg Hx    Esophageal cancer Neg Hx    Pancreatic cancer Neg Hx    Stomach cancer Neg Hx     Review of Systems  Constitutional: Negative.   Genitourinary: Negative.     Exam:   BP 126/80 (BP Location: Left Arm, Patient Position: Sitting, Cuff Size: Normal)   Pulse 72   Ht '5\' 2"'$  (1.575 m) Comment: Reported  Wt 106 lb 6.4 oz (48.3 kg)   LMP 02/23/2008 (Approximate)   BMI 19.46 kg/m   Height: '5\' 2"'$  (157.5 cm) (Reported)  General appearance: alert, cooperative and appears stated  age Breasts: normal appearance, no masses or tenderness Abdomen: soft, non-tender; bowel sounds normal; no masses,  no organomegaly Lymph nodes: Cervical, supraclavicular, and axillary nodes normal.  No abnormal inguinal nodes palpated Neurologic: Grossly normal  Pelvic: External genitalia:  no lesions              Urethra:  normal appearing urethra with no masses, tenderness or lesions              Bartholins and Skenes: normal                 Vagina: normal appearing vagina with atrophic changes and no discharge, no lesions              Cervix: no lesions              Pap taken: No. Bimanual Exam:  Uterus:  normal size, contour, position, consistency, mobility, non-tender              Adnexa: normal adnexa and no mass, fullness, tenderness               Rectovaginal: Confirms               Anus:  normal sphincter tone, no lesions  Chaperone, Octaviano Batty, CMA, was present for exam.  Assessment/Plan: 1. GYN exam for high-risk Medicare patient - Pap smear not indicated - Mammogram up to date - Colonoscopy 2016.  Follow up 10 years - Bone mineral density is likley due.  Will see when the last one was done. - lab work done with PCP, Dr. Louis Matte - vaccines reviewed/updated  2. 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 Lipoma of chest wall  6. Osteopenia, unspecified location

## 2023-01-12 ENCOUNTER — Encounter: Payer: Self-pay | Admitting: *Deleted

## 2023-01-29 DIAGNOSIS — K130 Diseases of lips: Secondary | ICD-10-CM | POA: Diagnosis not present

## 2023-01-29 DIAGNOSIS — Z85828 Personal history of other malignant neoplasm of skin: Secondary | ICD-10-CM | POA: Diagnosis not present

## 2023-01-29 DIAGNOSIS — L814 Other melanin hyperpigmentation: Secondary | ICD-10-CM | POA: Diagnosis not present

## 2023-03-25 DIAGNOSIS — L308 Other specified dermatitis: Secondary | ICD-10-CM | POA: Diagnosis not present

## 2023-03-25 DIAGNOSIS — L578 Other skin changes due to chronic exposure to nonionizing radiation: Secondary | ICD-10-CM | POA: Diagnosis not present

## 2023-03-25 DIAGNOSIS — D1801 Hemangioma of skin and subcutaneous tissue: Secondary | ICD-10-CM | POA: Diagnosis not present

## 2023-03-25 DIAGNOSIS — D2372 Other benign neoplasm of skin of left lower limb, including hip: Secondary | ICD-10-CM | POA: Diagnosis not present

## 2023-03-25 DIAGNOSIS — L812 Freckles: Secondary | ICD-10-CM | POA: Diagnosis not present

## 2023-03-25 DIAGNOSIS — Z85828 Personal history of other malignant neoplasm of skin: Secondary | ICD-10-CM | POA: Diagnosis not present

## 2023-03-25 DIAGNOSIS — L821 Other seborrheic keratosis: Secondary | ICD-10-CM | POA: Diagnosis not present

## 2023-04-03 ENCOUNTER — Ambulatory Visit: Payer: Medicare Other | Admitting: Nurse Practitioner

## 2023-04-07 DIAGNOSIS — Z1231 Encounter for screening mammogram for malignant neoplasm of breast: Secondary | ICD-10-CM | POA: Diagnosis not present

## 2023-04-07 DIAGNOSIS — Z8262 Family history of osteoporosis: Secondary | ICD-10-CM | POA: Diagnosis not present

## 2023-04-07 DIAGNOSIS — N951 Menopausal and female climacteric states: Secondary | ICD-10-CM | POA: Diagnosis not present

## 2023-04-13 ENCOUNTER — Other Ambulatory Visit: Payer: Self-pay | Admitting: *Deleted

## 2023-04-13 DIAGNOSIS — Z79899 Other long term (current) drug therapy: Secondary | ICD-10-CM

## 2023-04-13 DIAGNOSIS — Z8249 Family history of ischemic heart disease and other diseases of the circulatory system: Secondary | ICD-10-CM

## 2023-04-13 DIAGNOSIS — I2510 Atherosclerotic heart disease of native coronary artery without angina pectoris: Secondary | ICD-10-CM

## 2023-04-13 DIAGNOSIS — E785 Hyperlipidemia, unspecified: Secondary | ICD-10-CM

## 2023-04-13 MED ORDER — ROSUVASTATIN CALCIUM 5 MG PO TABS
5.0000 mg | ORAL_TABLET | Freq: Every day | ORAL | 1 refills | Status: DC
Start: 2023-04-13 — End: 2023-10-29

## 2023-04-15 ENCOUNTER — Encounter (HOSPITAL_BASED_OUTPATIENT_CLINIC_OR_DEPARTMENT_OTHER): Payer: Self-pay | Admitting: *Deleted

## 2023-05-14 DIAGNOSIS — R221 Localized swelling, mass and lump, neck: Secondary | ICD-10-CM | POA: Diagnosis not present

## 2023-05-14 DIAGNOSIS — M542 Cervicalgia: Secondary | ICD-10-CM | POA: Diagnosis not present

## 2023-05-22 DIAGNOSIS — E042 Nontoxic multinodular goiter: Secondary | ICD-10-CM | POA: Diagnosis not present

## 2023-05-22 DIAGNOSIS — R221 Localized swelling, mass and lump, neck: Secondary | ICD-10-CM | POA: Diagnosis not present

## 2023-05-22 DIAGNOSIS — M542 Cervicalgia: Secondary | ICD-10-CM | POA: Diagnosis not present

## 2023-06-19 DIAGNOSIS — M542 Cervicalgia: Secondary | ICD-10-CM | POA: Diagnosis not present

## 2023-06-19 DIAGNOSIS — R221 Localized swelling, mass and lump, neck: Secondary | ICD-10-CM | POA: Diagnosis not present

## 2023-06-23 DIAGNOSIS — M542 Cervicalgia: Secondary | ICD-10-CM | POA: Diagnosis not present

## 2023-06-23 DIAGNOSIS — R221 Localized swelling, mass and lump, neck: Secondary | ICD-10-CM | POA: Diagnosis not present

## 2023-06-26 DIAGNOSIS — E079 Disorder of thyroid, unspecified: Secondary | ICD-10-CM | POA: Diagnosis not present

## 2023-08-25 DIAGNOSIS — D171 Benign lipomatous neoplasm of skin and subcutaneous tissue of trunk: Secondary | ICD-10-CM | POA: Diagnosis not present

## 2023-08-25 DIAGNOSIS — L218 Other seborrheic dermatitis: Secondary | ICD-10-CM | POA: Diagnosis not present

## 2023-08-25 DIAGNOSIS — L578 Other skin changes due to chronic exposure to nonionizing radiation: Secondary | ICD-10-CM | POA: Diagnosis not present

## 2023-08-25 DIAGNOSIS — L812 Freckles: Secondary | ICD-10-CM | POA: Diagnosis not present

## 2023-08-25 DIAGNOSIS — D225 Melanocytic nevi of trunk: Secondary | ICD-10-CM | POA: Diagnosis not present

## 2023-08-25 DIAGNOSIS — L570 Actinic keratosis: Secondary | ICD-10-CM | POA: Diagnosis not present

## 2023-08-25 DIAGNOSIS — L821 Other seborrheic keratosis: Secondary | ICD-10-CM | POA: Diagnosis not present

## 2023-08-25 DIAGNOSIS — D2262 Melanocytic nevi of left upper limb, including shoulder: Secondary | ICD-10-CM | POA: Diagnosis not present

## 2023-08-25 DIAGNOSIS — Z85828 Personal history of other malignant neoplasm of skin: Secondary | ICD-10-CM | POA: Diagnosis not present

## 2023-08-26 ENCOUNTER — Ambulatory Visit: Payer: Medicare Other | Admitting: Cardiology

## 2023-09-21 DIAGNOSIS — M545 Low back pain, unspecified: Secondary | ICD-10-CM | POA: Diagnosis not present

## 2023-09-21 DIAGNOSIS — M25552 Pain in left hip: Secondary | ICD-10-CM | POA: Diagnosis not present

## 2023-09-25 ENCOUNTER — Other Ambulatory Visit: Payer: Self-pay | Admitting: *Deleted

## 2023-09-25 DIAGNOSIS — E785 Hyperlipidemia, unspecified: Secondary | ICD-10-CM

## 2023-09-25 DIAGNOSIS — Z8249 Family history of ischemic heart disease and other diseases of the circulatory system: Secondary | ICD-10-CM

## 2023-09-25 DIAGNOSIS — I471 Supraventricular tachycardia, unspecified: Secondary | ICD-10-CM

## 2023-09-25 MED ORDER — METOPROLOL TARTRATE 25 MG PO TABS
12.5000 mg | ORAL_TABLET | Freq: Two times a day (BID) | ORAL | 0 refills | Status: DC
Start: 2023-09-25 — End: 2023-12-30

## 2023-09-30 DIAGNOSIS — Z85828 Personal history of other malignant neoplasm of skin: Secondary | ICD-10-CM | POA: Diagnosis not present

## 2023-09-30 DIAGNOSIS — L603 Nail dystrophy: Secondary | ICD-10-CM | POA: Diagnosis not present

## 2023-09-30 DIAGNOSIS — L821 Other seborrheic keratosis: Secondary | ICD-10-CM | POA: Diagnosis not present

## 2023-10-19 DIAGNOSIS — M545 Low back pain, unspecified: Secondary | ICD-10-CM | POA: Diagnosis not present

## 2023-10-26 DIAGNOSIS — M5416 Radiculopathy, lumbar region: Secondary | ICD-10-CM | POA: Diagnosis not present

## 2023-10-27 DIAGNOSIS — L309 Dermatitis, unspecified: Secondary | ICD-10-CM | POA: Diagnosis not present

## 2023-10-27 DIAGNOSIS — Z85828 Personal history of other malignant neoplasm of skin: Secondary | ICD-10-CM | POA: Diagnosis not present

## 2023-10-27 DIAGNOSIS — L820 Inflamed seborrheic keratosis: Secondary | ICD-10-CM | POA: Diagnosis not present

## 2023-10-29 ENCOUNTER — Encounter: Payer: Self-pay | Admitting: Cardiology

## 2023-10-29 ENCOUNTER — Ambulatory Visit: Payer: Medicare Other | Attending: Cardiology | Admitting: Cardiology

## 2023-10-29 VITALS — BP 120/70 | HR 74 | Ht 62.0 in | Wt 101.0 lb

## 2023-10-29 DIAGNOSIS — I340 Nonrheumatic mitral (valve) insufficiency: Secondary | ICD-10-CM | POA: Diagnosis not present

## 2023-10-29 DIAGNOSIS — E785 Hyperlipidemia, unspecified: Secondary | ICD-10-CM | POA: Diagnosis not present

## 2023-10-29 DIAGNOSIS — I471 Supraventricular tachycardia, unspecified: Secondary | ICD-10-CM | POA: Insufficient documentation

## 2023-10-29 DIAGNOSIS — Z79899 Other long term (current) drug therapy: Secondary | ICD-10-CM | POA: Insufficient documentation

## 2023-10-29 DIAGNOSIS — Z8249 Family history of ischemic heart disease and other diseases of the circulatory system: Secondary | ICD-10-CM | POA: Diagnosis not present

## 2023-10-29 DIAGNOSIS — I2510 Atherosclerotic heart disease of native coronary artery without angina pectoris: Secondary | ICD-10-CM | POA: Diagnosis not present

## 2023-10-29 DIAGNOSIS — Z09 Encounter for follow-up examination after completed treatment for conditions other than malignant neoplasm: Secondary | ICD-10-CM | POA: Insufficient documentation

## 2023-10-29 MED ORDER — ROSUVASTATIN CALCIUM 5 MG PO TABS: 5.0000 mg | ORAL_TABLET | Freq: Every day | ORAL | 3 refills | Status: DC

## 2023-10-29 NOTE — Progress Notes (Signed)
Cardiology Office Note:  .   Date:  10/29/2023  ID:  Lindsey Robinson, DOB Jun 11, 1951, MRN [id number] PCP: Thana Ates, MD  [facility] Providers Cardiologist:  Donato Schultz, MD     History of Present Illness: .   Lindsey Robinson is a 72 y.o. female Discussed with the use of AI scribe   History of Present Illness   The patient, a 72 year old with a history of paroxysmal atrial tachycardia and hypertension, presented for a follow-up visit. She had an episode of recurrent palpitations in April 2022, which led to an ER visit in [place]. At that time, she was diagnosed with elevated blood pressure and PACs. An echocardiogram revealed an ejection fraction of 65%, mitral valve prolapse, mild to moderate mitral regurgitation, mild TR, and mild RV enlargement with preserved RV function. The patient was started on metoprolol, which resolved the symptoms.  In June 2022, the patient had a calcium score of 15 (48th percentile) and was started on Crestor 5mg . She reported doing well on this regimen, only needing to take half a tablet of metoprolol at times at night for adequate control of palpitations.  The patient has a family history of heart disease, with a brother who had coronary artery disease and underwent CABG in his fifties, a mother with atrial fibrillation, aortic stenosis, and end-stage renal disease, and a father with a history of coronary artery disease and MI.  The patient reported an episode of racing heart rate in August while in [place], which resolved on its own after about an hour. She has been taking metoprolol as needed and Crestor for cholesterol control. The patient reported feeling well overall, with no significant changes in symptoms.  The patient also reported some bilateral hand discoloration of unclear etiology, with no known history of Raynaud's or other autoimmune conditions. The last echocardiogram in May 2023 showed normal pumping function with mild leakiness of the  mitral valve or mild mitral regurgitation. The right ventricle also looked normal in size.           Studies Reviewed: Marland Kitchen   EKG Interpretation Date/Time:  Thursday October 29 2023 13:34:07 EST Ventricular Rate:  70 PR Interval:  144 QRS Duration:  82 QT Interval:  402 QTC Calculation: 434 R Axis:   44  Text Interpretation: Normal sinus rhythm Normal ECG When compared with ECG of 05-Oct-2006 12:55, No significant change was found Confirmed by Donato Schultz ([id number]) on 10/29/2023 1:35:01 PM    Results   LABS LDL: 63  DIAGNOSTIC Echocardiogram: Normal pumping function, mild mitral regurgitation, normal right ventricle size (04/09/2022) Calcium score: 15, 48th percentile (05/13/2021) Echocardiogram: Ejection fraction 65%, mitral valve prolapse, mild to moderate mitral regurgitation, mild tricuspid regurgitation, mild right ventricular enlargement, preserved right ventricular function (April 2022)     Risk Assessment/Calculations:            Physical Exam:   VS:  BP 120/70   Pulse 74   Ht 5\' 2"  (1.575 m)   Wt 101 lb (45.8 kg)   LMP 02/23/2008 (Approximate)   SpO2 99%   BMI 18.47 kg/m    Wt Readings from Last 3 Encounters:  10/29/23 101 lb (45.8 kg)  12/29/22 106 lb 6.4 oz (48.3 kg)  03/24/22 108 lb 3.2 oz (49.1 kg)    GEN: Well nourished, well developed in no acute distress NECK: No JVD; No carotid bruits CARDIAC: RRR, soft systolic murmur, no rubs, no gallops RESPIRATORY:  Clear to auscultation without rales, wheezing  or rhonchi  ABDOMEN: Soft, non-tender, non-distended EXTREMITIES:  No edema; No deformity   ASSESSMENT AND PLAN: .    Assessment and Plan    Paroxysmal Atrial Tachycardia Recurrent episodes of palpitations, most recently in August 2024, with heart rate reaching 145 bpm. Symptoms resolved spontaneously after about an hour. Previous episodes managed effectively with metoprolol. Echocardiogram from May 2023 showed normal pumping function, mild mitral  regurgitation, and normal right ventricle size. No dangerous heart rhythms noted. Discussed that these are not dangerous heart rhythms but can be annoying. Metoprolol acts as a 'mini fire extinguisher' to keep symptoms calm. - Continue metoprolol as needed, half a tablet at night - Schedule follow-up in two years  Mitral Valve Prolapse with Mild to Moderate Mitral Regurgitation Echocardiogram showed mitral valve prolapse with mild to moderate mitral regurgitation. No significant changes noted in the most recent echocardiogram from May 2023. No immediate intervention required. - Monitor for any changes in symptoms  Hypertension Elevated blood pressure, previously reaching 197 mmHg during an episode in [place]. Currently well-controlled with lifestyle modifications and medication. Discussed the importance of diet and exercise in managing blood pressure. - Continue current management and lifestyle modifications - Monitor blood pressure regularly  Hyperlipidemia Calcium score of 15 (48th percentile) in June 2022. LDL cholesterol at 63 mg/dL, well-controlled with rosuvastatin 5 mg. No adverse effects from the medication. Discussed that rosuvastatin helps lower cholesterol and acts to keep plaque stable and prevent heart attacks. Mentioned that future treatments may include easier options like annual pills. - Continue rosuvastatin 5 mg daily - Maintain diet and exercise regimen  General Health Maintenance Discussion on the importance of diet and exercise, particularly the Mediterranean diet. Emphasis on avoiding processed foods and maintaining a healthy lifestyle. Discussed the cultural shift needed to prioritize healthy eating despite busy lifestyles. - Adhere to Mediterranean diet - Continue regular physical activity  Follow-up - Schedule follow-up appointment in two years - Contact the clinic if any new symptoms arise.               Signed, Donato Schultz, MD

## 2023-10-29 NOTE — Progress Notes (Deleted)
ekg 

## 2023-10-29 NOTE — Patient Instructions (Signed)
Medication Instructions:  Your physician recommends that you continue on your current medications as directed. Please refer to the Current Medication list given to you today.  *If you need a refill on your cardiac medications before your next appointment, please call your pharmacy*  Lab Work: None ordered today.  Testing/Procedures: None ordered today.  Follow-Up: At [facility], you and your health needs are our priority.  As part of our continuing mission to provide you with exceptional heart care, we have created designated Provider Care Teams.  These Care Teams include your primary Cardiologist (physician) and Advanced Practice Providers (APPs -  Physician Assistants and Nurse Practitioners) who all work together to provide you with the care you need, when you need it.  Your next appointment:   2 year(s)  The format for your next appointment:   In Person  Provider:   Donato Schultz, MD

## 2023-11-09 DIAGNOSIS — M5416 Radiculopathy, lumbar region: Secondary | ICD-10-CM | POA: Diagnosis not present

## 2023-11-10 DIAGNOSIS — H018 Other specified inflammations of eyelid: Secondary | ICD-10-CM | POA: Diagnosis not present

## 2023-11-12 DIAGNOSIS — M8589 Other specified disorders of bone density and structure, multiple sites: Secondary | ICD-10-CM | POA: Diagnosis not present

## 2023-11-12 DIAGNOSIS — I4719 Other supraventricular tachycardia: Secondary | ICD-10-CM | POA: Diagnosis not present

## 2023-11-12 DIAGNOSIS — R070 Pain in throat: Secondary | ICD-10-CM | POA: Diagnosis not present

## 2023-11-12 DIAGNOSIS — E7800 Pure hypercholesterolemia, unspecified: Secondary | ICD-10-CM | POA: Diagnosis not present

## 2023-11-12 DIAGNOSIS — F5101 Primary insomnia: Secondary | ICD-10-CM | POA: Diagnosis not present

## 2023-11-12 DIAGNOSIS — E041 Nontoxic single thyroid nodule: Secondary | ICD-10-CM | POA: Diagnosis not present

## 2023-11-12 DIAGNOSIS — Z0000 Encounter for general adult medical examination without abnormal findings: Secondary | ICD-10-CM | POA: Diagnosis not present

## 2023-11-12 DIAGNOSIS — Z79899 Other long term (current) drug therapy: Secondary | ICD-10-CM | POA: Diagnosis not present

## 2023-12-30 ENCOUNTER — Other Ambulatory Visit: Payer: Self-pay | Admitting: Cardiovascular Disease

## 2023-12-30 DIAGNOSIS — Z8249 Family history of ischemic heart disease and other diseases of the circulatory system: Secondary | ICD-10-CM

## 2023-12-30 DIAGNOSIS — E785 Hyperlipidemia, unspecified: Secondary | ICD-10-CM

## 2023-12-30 DIAGNOSIS — I471 Supraventricular tachycardia, unspecified: Secondary | ICD-10-CM

## 2023-12-31 ENCOUNTER — Encounter (HOSPITAL_BASED_OUTPATIENT_CLINIC_OR_DEPARTMENT_OTHER): Payer: Self-pay | Admitting: Obstetrics & Gynecology

## 2023-12-31 ENCOUNTER — Ambulatory Visit (HOSPITAL_BASED_OUTPATIENT_CLINIC_OR_DEPARTMENT_OTHER): Payer: Medicare Other | Admitting: Obstetrics & Gynecology

## 2023-12-31 ENCOUNTER — Other Ambulatory Visit (HOSPITAL_COMMUNITY)
Admission: RE | Admit: 2023-12-31 | Discharge: 2023-12-31 | Disposition: A | Discharge status: Home / Self Care | Payer: Medicare Other | Source: Ambulatory Visit | Attending: Obstetrics & Gynecology | Admitting: Obstetrics & Gynecology

## 2023-12-31 VITALS — BP 122/59 | HR 69 | Ht 61.25 in | Wt 102.0 lb

## 2023-12-31 DIAGNOSIS — Z124 Encounter for screening for malignant neoplasm of cervix: Secondary | ICD-10-CM

## 2023-12-31 DIAGNOSIS — Z01419 Encounter for gynecological examination (general) (routine) without abnormal findings: Secondary | ICD-10-CM | POA: Diagnosis not present

## 2023-12-31 DIAGNOSIS — B009 Herpesviral infection, unspecified: Secondary | ICD-10-CM

## 2023-12-31 DIAGNOSIS — M85852 Other specified disorders of bone density and structure, left thigh: Secondary | ICD-10-CM

## 2023-12-31 DIAGNOSIS — M85851 Other specified disorders of bone density and structure, right thigh: Secondary | ICD-10-CM | POA: Diagnosis not present

## 2023-12-31 MED ORDER — VALACYCLOVIR HCL 500 MG PO TABS
ORAL_TABLET | ORAL | 4 refills | Status: AC
Start: 2023-12-31 — End: ?

## 2023-12-31 NOTE — Progress Notes (Signed)
 73 y.o. G26P0002 Married White or Caucasian female here for breast and pelvic exam.  I am also following her for PMP status.  Doing well.  Had thyroid  nodule.  Denies vaginal bleeding.  Patient's last menstrual period was 02/23/2008 (approximate).          Sexually active: Yes.    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 Dwight.  Last wellness appt was 10/2023.  Did blood work at that appt:   Vaccines are up to date:  did not have an influenza vaccination Colonoscopy:  03/16/2015 MMG:  04/07/2023 Negative BMD:  04/07/2023 Osteopenia Last pap smear:  08/09/2019 Negative.   H/o abnormal pap smear:  remote hx    reports that she has quit smoking. Her smoking use included cigarettes. She has never used smokeless tobacco. She reports current alcohol use of about 14.0 standard drinks of alcohol per week. She reports that she does not use drugs.  Past Medical History:  Diagnosis Date   Abnormal Pap smear of cervix    Arthritis    BCC (basal cell carcinoma of skin)    on lip. Had Mohs surgery.   Carpal tunnel syndrome    Cataract    right eye 04/2014   Fibroids    HSV-2 infection    Hypertension    Palpitations    PAT (paroxysmal atrial tachycardia) (HCC) 9/14   Trigger finger    Urge incontinence of urine     Past Surgical History:  Procedure Laterality Date   BREAST ENHANCEMENT SURGERY  1973   CARPAL TUNNEL RELEASE  10/26/2012   Procedure: CARPAL TUNNEL RELEASE;  Surgeon: Lamar LULLA Leonor Mickey., MD;  Location: [facility];  Service: Orthopedics;  Laterality: Left;   CATARACT EXTRACTION  2013   CATARACT EXTRACTION  6/15   EYE SURGERY Right 03/30/2017   right eye lid BX   FACIAL COSMETIC SURGERY  2005   GREAT TOE ARTHRODESIS, INTERPHALANGEAL JOINT  2007   rt   HYSTEROSCOPY WITH RESECTOSCOPE  6/09       JOINT REPLACEMENT Right 07/29/2016   right thumb.  Dr. Camella.   MOHS SURGERY  09/2016   Dr. Jadine   TONSILLECTOMY  12/07   T&A    Current  Outpatient Medications  Medication Sig Dispense Refill   clobetasol cream (TEMOVATE) 0.05 % Apply 1 application topically as needed.      doxycycline (PERIOSTAT) 20 MG tablet Take 20 mg by mouth every other day.     fluocinonide (LIDEX) 0.05 % external solution as needed.     metoprolol  tartrate (LOPRESSOR ) 25 MG tablet TAKE 1/2 TABLET BY MOUTH TWICE DAILY 90 tablet 6   rosuvastatin  (CRESTOR ) 5 MG tablet Take 1 tablet (5 mg total) by mouth daily. 90 tablet 3   valACYclovir  (VALTREX ) 500 MG tablet TAKE ONE TABLET EACH DAY 90 tablet 4   Vitamin D , Ergocalciferol , (DRISDOL ) 1.25 MG (50000 UNIT) CAPS capsule Take 50,000 Units by mouth every 14 (fourteen) days.     XIIDRA 5 % SOLN INSTILL ONE DROP IN EACH EYE EVERY 12 HOURS     zaleplon (SONATA) 5 MG capsule Take 5 mg by mouth at bedtime as needed.     No current facility-administered medications for this visit.    Family History  Problem Relation Age of Onset   Hypertension Mother    Kidney disease Mother    Kidney Stones Father    Heart attack Father    Diabetes Paternal  Aunt    Brain cancer Maternal Grandmother    Asthma Daughter    Diabetes Brother    Heart disease Brother    Colon cancer Neg Hx    Esophageal cancer Neg Hx    Pancreatic cancer Neg Hx    Stomach cancer Neg Hx     Review of Systems  Constitutional: Negative.   Genitourinary: Negative.     Exam:   BP (!) 122/59 (BP Location: Right Arm, Patient Position: Sitting, Cuff Size: Normal)   Pulse 69   Ht 5' 1.25 (1.556 m)   Wt 102 lb (46.3 kg)   LMP 02/23/2008 (Approximate)   BMI 19.12 kg/m   Height: 5' 1.25 (155.6 cm)  General appearance: alert, cooperative and appears stated age Breasts: normal appearance, no masses or tenderness Abdomen: soft, non-tender; bowel sounds normal; no masses,  no organomegaly Lymph nodes: Cervical, supraclavicular, and axillary nodes normal.  No abnormal inguinal nodes palpated Neurologic: Grossly normal  Pelvic: External  genitalia:  no lesions              Urethra:  normal appearing urethra with no masses, tenderness or lesions              Bartholins and Skenes: normal                 Vagina: normal appearing vagina with atrophic changes and no discharge, no lesions              Cervix: no lesions              Pap taken: Yes.   Bimanual Exam:  Uterus:  normal size, contour, position, consistency, mobility, non-tender              Adnexa: normal adnexa and no mass, fullness, tenderness               Rectovaginal: Confirms               Anus:  normal sphincter tone, no lesions  Chaperone, Bascom Kotyk, CMA, was present for exam.  Assessment/Plan: 1. Encntr for gyn exam (general) (routine) w/o abn findings (Primary) - Pap smear obtained today - Mammogram 04/06/2021 - Colonoscopy 03/16/2015 - Bone mineral density 04/07/2023 - lab work done with PCP, Dr. Dwight - vaccines reviewed/updated  2. Osteopenia of both hips - on vit D - repeat BMD in 2-3 years  3. Cervical cancer screening - Cytology - PAP( [place]) - PR OBTAINING SCREEN PAP SMEAR  4. HSV-1 (herpes simplex virus 1) infection - valACYclovir  (VALTREX ) 500 MG tablet; TAKE ONE TABLET EACH DAY  Dispense: 90 tablet; Refill: 4  5. HSV-2 (herpes simplex virus 2) infection

## 2024-01-01 DIAGNOSIS — M5416 Radiculopathy, lumbar region: Secondary | ICD-10-CM | POA: Diagnosis not present

## 2024-01-06 LAB — CYTOLOGY - PAP: Diagnosis: NEGATIVE

## 2024-01-11 ENCOUNTER — Encounter (HOSPITAL_BASED_OUTPATIENT_CLINIC_OR_DEPARTMENT_OTHER): Payer: Self-pay | Admitting: Obstetrics & Gynecology

## 2024-02-15 DIAGNOSIS — I10 Essential (primary) hypertension: Secondary | ICD-10-CM | POA: Diagnosis not present

## 2024-02-15 DIAGNOSIS — I471 Supraventricular tachycardia, unspecified: Secondary | ICD-10-CM | POA: Diagnosis not present

## 2024-02-15 DIAGNOSIS — R002 Palpitations: Secondary | ICD-10-CM | POA: Diagnosis not present

## 2024-02-22 DIAGNOSIS — E7800 Pure hypercholesterolemia, unspecified: Secondary | ICD-10-CM | POA: Diagnosis not present

## 2024-02-23 DIAGNOSIS — L570 Actinic keratosis: Secondary | ICD-10-CM | POA: Diagnosis not present

## 2024-02-23 DIAGNOSIS — D2239 Melanocytic nevi of other parts of face: Secondary | ICD-10-CM | POA: Diagnosis not present

## 2024-02-23 DIAGNOSIS — L565 Disseminated superficial actinic porokeratosis (DSAP): Secondary | ICD-10-CM | POA: Diagnosis not present

## 2024-02-23 DIAGNOSIS — L650 Telogen effluvium: Secondary | ICD-10-CM | POA: Diagnosis not present

## 2024-02-23 DIAGNOSIS — L718 Other rosacea: Secondary | ICD-10-CM | POA: Diagnosis not present

## 2024-02-23 DIAGNOSIS — Z85828 Personal history of other malignant neoplasm of skin: Secondary | ICD-10-CM | POA: Diagnosis not present

## 2024-02-23 DIAGNOSIS — L820 Inflamed seborrheic keratosis: Secondary | ICD-10-CM | POA: Diagnosis not present

## 2024-02-23 DIAGNOSIS — L821 Other seborrheic keratosis: Secondary | ICD-10-CM | POA: Diagnosis not present

## 2024-03-16 DIAGNOSIS — M542 Cervicalgia: Secondary | ICD-10-CM | POA: Diagnosis not present

## 2024-03-16 DIAGNOSIS — M25512 Pain in left shoulder: Secondary | ICD-10-CM | POA: Diagnosis not present

## 2024-03-22 ENCOUNTER — Ambulatory Visit (HOSPITAL_BASED_OUTPATIENT_CLINIC_OR_DEPARTMENT_OTHER): Payer: Medicare Other | Admitting: Obstetrics & Gynecology

## 2024-04-08 DIAGNOSIS — H15121 Nodular episcleritis, right eye: Secondary | ICD-10-CM | POA: Diagnosis not present

## 2024-04-20 DIAGNOSIS — Z1231 Encounter for screening mammogram for malignant neoplasm of breast: Secondary | ICD-10-CM | POA: Diagnosis not present

## 2024-04-23 DIAGNOSIS — E7800 Pure hypercholesterolemia, unspecified: Secondary | ICD-10-CM | POA: Diagnosis not present

## 2024-04-25 ENCOUNTER — Encounter (HOSPITAL_BASED_OUTPATIENT_CLINIC_OR_DEPARTMENT_OTHER): Payer: Self-pay | Admitting: *Deleted

## 2024-05-18 DIAGNOSIS — Z79899 Other long term (current) drug therapy: Secondary | ICD-10-CM | POA: Diagnosis not present

## 2024-06-15 DIAGNOSIS — L570 Actinic keratosis: Secondary | ICD-10-CM | POA: Diagnosis not present

## 2024-06-15 DIAGNOSIS — D485 Neoplasm of uncertain behavior of skin: Secondary | ICD-10-CM | POA: Diagnosis not present

## 2024-06-15 DIAGNOSIS — Z85828 Personal history of other malignant neoplasm of skin: Secondary | ICD-10-CM | POA: Diagnosis not present

## 2024-06-15 DIAGNOSIS — L578 Other skin changes due to chronic exposure to nonionizing radiation: Secondary | ICD-10-CM | POA: Diagnosis not present

## 2024-06-21 DIAGNOSIS — E041 Nontoxic single thyroid nodule: Secondary | ICD-10-CM | POA: Diagnosis not present

## 2024-06-21 DIAGNOSIS — E079 Disorder of thyroid, unspecified: Secondary | ICD-10-CM | POA: Diagnosis not present

## 2024-06-23 DIAGNOSIS — E7800 Pure hypercholesterolemia, unspecified: Secondary | ICD-10-CM | POA: Diagnosis not present

## 2024-06-24 DIAGNOSIS — R490 Dysphonia: Secondary | ICD-10-CM | POA: Diagnosis not present

## 2024-06-24 DIAGNOSIS — E079 Disorder of thyroid, unspecified: Secondary | ICD-10-CM | POA: Diagnosis not present

## 2024-07-07 DIAGNOSIS — E041 Nontoxic single thyroid nodule: Secondary | ICD-10-CM | POA: Diagnosis not present

## 2024-07-21 DIAGNOSIS — E079 Disorder of thyroid, unspecified: Secondary | ICD-10-CM | POA: Diagnosis not present

## 2024-07-24 DIAGNOSIS — E7800 Pure hypercholesterolemia, unspecified: Secondary | ICD-10-CM | POA: Diagnosis not present

## 2024-08-24 DIAGNOSIS — L812 Freckles: Secondary | ICD-10-CM | POA: Diagnosis not present

## 2024-08-24 DIAGNOSIS — Z85828 Personal history of other malignant neoplasm of skin: Secondary | ICD-10-CM | POA: Diagnosis not present

## 2024-08-24 DIAGNOSIS — L821 Other seborrheic keratosis: Secondary | ICD-10-CM | POA: Diagnosis not present

## 2024-08-24 DIAGNOSIS — L650 Telogen effluvium: Secondary | ICD-10-CM | POA: Diagnosis not present

## 2024-08-24 DIAGNOSIS — I788 Other diseases of capillaries: Secondary | ICD-10-CM | POA: Diagnosis not present

## 2024-08-24 DIAGNOSIS — L820 Inflamed seborrheic keratosis: Secondary | ICD-10-CM | POA: Diagnosis not present

## 2024-08-24 DIAGNOSIS — L570 Actinic keratosis: Secondary | ICD-10-CM | POA: Diagnosis not present

## 2024-08-29 DIAGNOSIS — R490 Dysphonia: Secondary | ICD-10-CM | POA: Diagnosis not present

## 2024-08-29 DIAGNOSIS — J383 Other diseases of vocal cords: Secondary | ICD-10-CM | POA: Diagnosis not present

## 2024-08-29 DIAGNOSIS — Z87891 Personal history of nicotine dependence: Secondary | ICD-10-CM | POA: Diagnosis not present

## 2024-10-04 DIAGNOSIS — R002 Palpitations: Secondary | ICD-10-CM | POA: Diagnosis not present

## 2024-10-04 DIAGNOSIS — I10 Essential (primary) hypertension: Secondary | ICD-10-CM | POA: Diagnosis not present

## 2024-10-08 DIAGNOSIS — I714 Abdominal aortic aneurysm, without rupture, unspecified: Secondary | ICD-10-CM | POA: Diagnosis not present

## 2024-10-25 ENCOUNTER — Other Ambulatory Visit: Payer: Self-pay | Admitting: Cardiology

## 2024-10-25 DIAGNOSIS — Z79899 Other long term (current) drug therapy: Secondary | ICD-10-CM

## 2024-10-25 DIAGNOSIS — E785 Hyperlipidemia, unspecified: Secondary | ICD-10-CM

## 2024-10-25 DIAGNOSIS — I2510 Atherosclerotic heart disease of native coronary artery without angina pectoris: Secondary | ICD-10-CM

## 2024-10-25 DIAGNOSIS — Z8249 Family history of ischemic heart disease and other diseases of the circulatory system: Secondary | ICD-10-CM

## 2024-11-02 ENCOUNTER — Encounter: Payer: Self-pay | Admitting: Cardiology

## 2024-11-02 DIAGNOSIS — I2510 Atherosclerotic heart disease of native coronary artery without angina pectoris: Secondary | ICD-10-CM

## 2024-11-02 DIAGNOSIS — E785 Hyperlipidemia, unspecified: Secondary | ICD-10-CM

## 2024-11-02 DIAGNOSIS — Z79899 Other long term (current) drug therapy: Secondary | ICD-10-CM

## 2024-11-02 DIAGNOSIS — Z8249 Family history of ischemic heart disease and other diseases of the circulatory system: Secondary | ICD-10-CM

## 2024-11-03 MED ORDER — ROSUVASTATIN CALCIUM 5 MG PO TABS: 5.0000 mg | ORAL_TABLET | Freq: Every day | ORAL | 3 refills | Status: AC

## 2024-11-10 DIAGNOSIS — H16223 Keratoconjunctivitis sicca, not specified as Sjogren's, bilateral: Secondary | ICD-10-CM | POA: Diagnosis not present

## 2025-04-06 ENCOUNTER — Ambulatory Visit (HOSPITAL_BASED_OUTPATIENT_CLINIC_OR_DEPARTMENT_OTHER): Payer: Medicare Other | Admitting: Obstetrics & Gynecology
